# Patient Record
Sex: Female | Born: 1975 | Race: Black or African American | Hispanic: No | Marital: Married | Smoking: Never smoker
Health system: Southern US, Community
[De-identification: ages and names within clinical notes are randomized; demographics above are authoritative.]

## PROBLEM LIST (undated history)

## (undated) ENCOUNTER — Inpatient Hospital Stay (HOSPITAL_COMMUNITY): Payer: Self-pay

## (undated) DIAGNOSIS — O24419 Gestational diabetes mellitus in pregnancy, unspecified control: Secondary | ICD-10-CM

## (undated) DIAGNOSIS — F32A Depression, unspecified: Secondary | ICD-10-CM

## (undated) DIAGNOSIS — N979 Female infertility, unspecified: Secondary | ICD-10-CM

## (undated) DIAGNOSIS — E282 Polycystic ovarian syndrome: Secondary | ICD-10-CM

## (undated) DIAGNOSIS — E221 Hyperprolactinemia: Secondary | ICD-10-CM

## (undated) DIAGNOSIS — F419 Anxiety disorder, unspecified: Secondary | ICD-10-CM

## (undated) DIAGNOSIS — K219 Gastro-esophageal reflux disease without esophagitis: Secondary | ICD-10-CM

## (undated) DIAGNOSIS — T7840XA Allergy, unspecified, initial encounter: Secondary | ICD-10-CM

## (undated) DIAGNOSIS — Z789 Other specified health status: Secondary | ICD-10-CM

## (undated) DIAGNOSIS — Z8619 Personal history of other infectious and parasitic diseases: Secondary | ICD-10-CM

## (undated) DIAGNOSIS — F329 Major depressive disorder, single episode, unspecified: Secondary | ICD-10-CM

## (undated) HISTORY — DX: Gastro-esophageal reflux disease without esophagitis: K21.9

## (undated) HISTORY — DX: Allergy, unspecified, initial encounter: T78.40XA

## (undated) HISTORY — DX: Hyperprolactinemia: E22.1

## (undated) HISTORY — DX: Anxiety disorder, unspecified: F41.9

## (undated) HISTORY — DX: Personal history of other infectious and parasitic diseases: Z86.19

## (undated) HISTORY — DX: Depression, unspecified: F32.A

## (undated) HISTORY — PX: HERNIA REPAIR: SHX51

## (undated) HISTORY — DX: Female infertility, unspecified: N97.9

## (undated) HISTORY — DX: Gestational diabetes mellitus in pregnancy, unspecified control: O24.419

---
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
## 1999-05-16 ENCOUNTER — Emergency Department (HOSPITAL_COMMUNITY): Admission: EM | Admit: 1999-05-16 | Discharge: 1999-05-17 | Payer: Self-pay | Admitting: Emergency Medicine

## 1999-07-22 ENCOUNTER — Emergency Department (HOSPITAL_COMMUNITY): Admission: EM | Admit: 1999-07-22 | Discharge: 1999-07-22 | Payer: Self-pay

## 1999-10-19 ENCOUNTER — Other Ambulatory Visit: Admission: RE | Admit: 1999-10-19 | Discharge: 1999-10-19 | Payer: Self-pay | Admitting: *Deleted

## 2000-09-02 ENCOUNTER — Emergency Department (HOSPITAL_COMMUNITY): Admission: EM | Admit: 2000-09-02 | Discharge: 2000-09-02 | Payer: Self-pay | Admitting: Emergency Medicine

## 2000-12-21 ENCOUNTER — Emergency Department (HOSPITAL_COMMUNITY): Admission: EM | Admit: 2000-12-21 | Discharge: 2000-12-21 | Payer: Self-pay

## 2001-04-16 ENCOUNTER — Other Ambulatory Visit: Admission: RE | Admit: 2001-04-16 | Discharge: 2001-04-16 | Payer: Self-pay | Admitting: *Deleted

## 2003-05-03 ENCOUNTER — Other Ambulatory Visit: Admission: RE | Admit: 2003-05-03 | Discharge: 2003-05-03 | Payer: Self-pay | Admitting: Obstetrics and Gynecology

## 2003-10-03 ENCOUNTER — Emergency Department (HOSPITAL_COMMUNITY): Admission: EM | Admit: 2003-10-03 | Discharge: 2003-10-04 | Payer: Self-pay | Admitting: Emergency Medicine

## 2004-10-27 ENCOUNTER — Emergency Department (HOSPITAL_COMMUNITY): Admission: EM | Admit: 2004-10-27 | Discharge: 2004-10-27 | Payer: Self-pay | Admitting: Emergency Medicine

## 2011-10-16 ENCOUNTER — Other Ambulatory Visit: Payer: Self-pay | Admitting: Obstetrics and Gynecology

## 2011-10-23 ENCOUNTER — Ambulatory Visit
Admission: RE | Admit: 2011-10-23 | Discharge: 2011-10-23 | Disposition: A | Discharge status: Home / Self Care | Payer: 59 | Source: Ambulatory Visit | Attending: Obstetrics and Gynecology | Admitting: Obstetrics and Gynecology

## 2011-10-23 MED ORDER — GADOBENATE DIMEGLUMINE 529 MG/ML IV SOLN
15.0000 mL | Freq: Once | INTRAVENOUS | Status: AC | PRN
  Administered 2011-10-23: 15 mL via INTRAVENOUS

## 2012-06-22 DIAGNOSIS — I10 Essential (primary) hypertension: Secondary | ICD-10-CM | POA: Insufficient documentation

## 2012-06-22 DIAGNOSIS — N915 Oligomenorrhea, unspecified: Secondary | ICD-10-CM | POA: Insufficient documentation

## 2013-10-27 ENCOUNTER — Inpatient Hospital Stay (HOSPITAL_COMMUNITY)
Admission: AD | Admit: 2013-10-27 | Discharge: 2013-10-27 | Disposition: A | Discharge status: Home / Self Care | Payer: 59 | Source: Ambulatory Visit | Attending: Obstetrics and Gynecology | Admitting: Obstetrics and Gynecology

## 2013-10-27 ENCOUNTER — Encounter (HOSPITAL_COMMUNITY): Payer: Self-pay | Admitting: *Deleted

## 2013-10-27 ENCOUNTER — Other Ambulatory Visit: Payer: Self-pay | Admitting: Obstetrics and Gynecology

## 2013-10-27 DIAGNOSIS — O00109 Unspecified tubal pregnancy without intrauterine pregnancy: Secondary | ICD-10-CM | POA: Insufficient documentation

## 2013-10-27 HISTORY — DX: Other specified health status: Z78.9

## 2013-10-27 LAB — TYPE AND SCREEN
ABO/RH(D): A POS
Antibody Screen: NEGATIVE

## 2013-10-27 LAB — ABO/RH: ABO/RH(D): A POS

## 2013-10-27 LAB — CBC WITH DIFFERENTIAL/PLATELET
Basophils Absolute: 0 10*3/uL (ref 0.0–0.1)
Basophils Relative: 0 % (ref 0–1)
Eosinophils Absolute: 0.1 10*3/uL (ref 0.0–0.7)
Eosinophils Relative: 1 % (ref 0–5)
HCT: 37.2 % (ref 36.0–46.0)
Hemoglobin: 12.5 g/dL (ref 12.0–15.0)
Lymphocytes Relative: 47 % — ABNORMAL HIGH (ref 12–46)
Lymphs Abs: 4.6 10*3/uL — ABNORMAL HIGH (ref 0.7–4.0)
MCH: 27.5 pg (ref 26.0–34.0)
MCHC: 33.6 g/dL (ref 30.0–36.0)
MCV: 81.8 fL (ref 78.0–100.0)
Monocytes Absolute: 0.9 10*3/uL (ref 0.1–1.0)
Monocytes Relative: 9 % (ref 3–12)
Neutro Abs: 4.1 10*3/uL (ref 1.7–7.7)
Neutrophils Relative %: 42 % — ABNORMAL LOW (ref 43–77)
Platelets: 364 10*3/uL (ref 150–400)
RBC: 4.55 MIL/uL (ref 3.87–5.11)
RDW: 13.5 % (ref 11.5–15.5)
WBC: 9.7 10*3/uL (ref 4.0–10.5)

## 2013-10-27 LAB — CREATININE, SERUM
Creatinine, Ser: 1.1 mg/dL (ref 0.50–1.10)
GFR calc Af Amer: 74 mL/min — ABNORMAL LOW (ref >90)
GFR calc non Af Amer: 64 mL/min — ABNORMAL LOW (ref >90)

## 2013-10-27 LAB — HCG, QUANTITATIVE, PREGNANCY: hCG, Beta Chain, Quant, S: 343 m[IU]/mL — ABNORMAL HIGH (ref <5)

## 2013-10-27 LAB — AST: AST: 15 U/L (ref 0–37)

## 2013-10-27 LAB — BUN: BUN: 11 mg/dL (ref 6–23)

## 2013-10-27 MED ORDER — METHOTREXATE INJECTION FOR WOMEN'S HOSPITAL
50.0000 mg/m2 | Freq: Once | INTRAMUSCULAR | Status: AC
  Administered 2013-10-27: 115 mg via INTRAMUSCULAR
  Filled 2013-10-27: qty 2.3

## 2013-10-27 NOTE — MAU Note (Signed)
Pt in for Methotrexate inj for ectopic pregnancy.

## 2013-10-30 ENCOUNTER — Inpatient Hospital Stay (HOSPITAL_COMMUNITY)
Admission: AD | Admit: 2013-10-30 | Discharge: 2013-10-30 | Disposition: A | Discharge status: Home / Self Care | Payer: 59 | Source: Ambulatory Visit | Attending: Obstetrics and Gynecology | Admitting: Obstetrics and Gynecology

## 2013-10-30 ENCOUNTER — Inpatient Hospital Stay (HOSPITAL_COMMUNITY): Payer: 59

## 2013-10-30 DIAGNOSIS — O00109 Unspecified tubal pregnancy without intrauterine pregnancy: Secondary | ICD-10-CM | POA: Insufficient documentation

## 2013-10-30 LAB — HCG, QUANTITATIVE, PREGNANCY: hCG, Beta Chain, Quant, S: 542 m[IU]/mL — ABNORMAL HIGH (ref <5)

## 2013-10-30 NOTE — MAU Note (Signed)
Dr Juliene Pina here discussing test results and plan of care with pt

## 2013-10-30 NOTE — MAU Note (Signed)
Dr Juliene Pina notified of pt's u/s report. Will come see pt and discuss results and plan of care. Pt aware

## 2013-10-30 NOTE — MAU Note (Signed)
Pt d/c ambulatory with spouse. Will f/u in office Tues as scheduled. Verbal instructions given to pt by Dr Juliene Pina.

## 2013-10-30 NOTE — MAU Note (Signed)
Loanne Drilling CNM called back. Will put in order for pelvic u/s and then call Dr Juliene Pina with results. Pt aware

## 2013-10-30 NOTE — MAU Provider Note (Signed)
  History     CSN: [id number]  Arrival date and time: 10/30/13 1814   None    Chief Complaint  Patient presents with  . Repeat BHCG post MTX    HPI D4 s/p Methotrexate for low abn rising bHCG. Methotrexate on 10/29. Here for lab and denies any complaints. Feels slight cramping and some left pelvic pain not needing any pain meds.  Labs reviewed. RIse noted from D1 to D4.   Past Medical History  Diagnosis Date  . Medical history non-contributory     Past Surgical History  Procedure Laterality Date  . Hernia repair      Family History  Problem Relation Age of Onset  . Diabetes Mother   . Cancer Mother   . Heart disease Mother   . Diabetes Father     History  Substance Use Topics  . Smoking status: Never Smoker   . Smokeless tobacco: Not on file  . Alcohol Use: No    Allergies: No Known Allergies  Prescriptions prior to admission  Medication Sig Dispense Refill  . metFORMIN (GLUCOPHAGE) 500 MG tablet Take 500 mg by mouth 2 (two) times daily with a meal.      . Prenatal Vit-Fe Fumarate-FA (MULTIVITAMIN-PRENATAL) 27-0.8 MG TABS tablet Take 1 tablet by mouth daily at 12 noon.        ROS Physical Exam   Blood pressure 135/81, pulse 82, resp. rate 20.  Physical Exam A&O x 3, no acute distress. Pleasant HEENT neg CV RRR Abdo soft, non tender, non acute Extr no edema/ tenderness Pelvic deferred, tolerated pelvic sono without pain  MAU Course  Procedures Pelvic sono - No IUP or EUP noted, no pelvic masses, no free fluid noted. CLC noted.   Assessment and Plan  37 yo, infertility with Femara pregnancy, abnormal rise in HCG and being treated for suspected ectopic or possible failed IUP. No pain or bleeding but rise in HCG from day1 to day 4. Pelvic sono stable. Reviewed ectopic precautions and warning s/s. Understands needs D7 HCG to assess if successful treatment or repeat treatment vs surgery vs d&c needed. F/up office for labs on 11/4.    Jahlil Ziller  R 10/30/2013, 10:15 PM

## 2013-10-30 NOTE — MAU Note (Signed)
Blanchie Dessert CNM notified of pt's BHCG result. CNM will talk with Dr Juliene Pina and call back.

## 2014-04-07 ENCOUNTER — Other Ambulatory Visit (HOSPITAL_COMMUNITY): Payer: Self-pay | Admitting: Obstetrics and Gynecology

## 2014-04-07 DIAGNOSIS — N970 Female infertility associated with anovulation: Secondary | ICD-10-CM

## 2014-04-13 ENCOUNTER — Ambulatory Visit (HOSPITAL_COMMUNITY)
Admission: RE | Admit: 2014-04-13 | Discharge: 2014-04-13 | Disposition: A | Discharge status: Home / Self Care | Payer: 59 | Source: Ambulatory Visit | Attending: Obstetrics and Gynecology | Admitting: Obstetrics and Gynecology

## 2014-04-13 ENCOUNTER — Encounter (INDEPENDENT_AMBULATORY_CARE_PROVIDER_SITE_OTHER): Payer: Self-pay

## 2014-04-13 DIAGNOSIS — N970 Female infertility associated with anovulation: Secondary | ICD-10-CM

## 2014-04-13 DIAGNOSIS — N979 Female infertility, unspecified: Secondary | ICD-10-CM | POA: Insufficient documentation

## 2014-04-13 MED ORDER — IOHEXOL 300 MG/ML  SOLN
20.0000 mL | Freq: Once | INTRAMUSCULAR | Status: AC | PRN
  Administered 2014-04-13: 20 mL

## 2014-10-31 ENCOUNTER — Encounter (HOSPITAL_COMMUNITY): Payer: Self-pay | Admitting: *Deleted

## 2015-02-23 LAB — OB RESULTS CONSOLE ABO/RH: RH TYPE: POSITIVE

## 2015-02-23 LAB — OB RESULTS CONSOLE RPR: RPR: NONREACTIVE

## 2015-02-23 LAB — OB RESULTS CONSOLE HIV ANTIBODY (ROUTINE TESTING): HIV: NONREACTIVE

## 2015-02-23 LAB — OB RESULTS CONSOLE RUBELLA ANTIBODY, IGM: Rubella: IMMUNE

## 2015-02-23 LAB — OB RESULTS CONSOLE ANTIBODY SCREEN: Antibody Screen: NEGATIVE

## 2015-02-23 LAB — OB RESULTS CONSOLE HEPATITIS B SURFACE ANTIGEN: HEP B S AG: NEGATIVE

## 2015-03-07 LAB — OB RESULTS CONSOLE GC/CHLAMYDIA
CHLAMYDIA, DNA PROBE: NEGATIVE
Gonorrhea: NEGATIVE

## 2015-07-12 ENCOUNTER — Encounter: Payer: 59 | Attending: Obstetrics and Gynecology

## 2015-07-12 VITALS — Ht 67.5 in | Wt 259.3 lb

## 2015-07-12 DIAGNOSIS — Z6839 Body mass index (BMI) 39.0-39.9, adult: Secondary | ICD-10-CM | POA: Diagnosis not present

## 2015-07-12 DIAGNOSIS — Z3A00 Weeks of gestation of pregnancy not specified: Secondary | ICD-10-CM | POA: Insufficient documentation

## 2015-07-12 DIAGNOSIS — O24410 Gestational diabetes mellitus in pregnancy, diet controlled: Secondary | ICD-10-CM | POA: Diagnosis not present

## 2015-07-16 NOTE — Progress Notes (Signed)
  Patient was seen on 07/12/15 for Gestational Diabetes self-management . The following learning objectives were met by the patient :   States the definition of Gestational Diabetes  States why dietary management is important in controlling blood glucose  Describes the effects of carbohydrates on blood glucose levels  Demonstrates ability to create a balanced meal plan  Demonstrates carbohydrate counting   States when to check blood glucose levels  Demonstrates proper blood glucose monitoring techniques  States the effect of stress and exercise on blood glucose levels  States the importance of limiting caffeine and abstaining from alcohol and smoking  Plan:  Aim for 2 Carb Choices per meal (30 grams) +/- 1 either way for breakfast Aim for 3 Carb Choices per meal (45 grams) +/- 1 either way from lunch and dinner Aim for 1-2 Carbs per snack Begin reading food labels for Total Carbohydrate and sugar grams of foods Consider  increasing your activity level by walking daily as tolerated Begin checking BG before breakfast and 2 hours after first bit of breakfast, lunch and dinner after  as directed by MD  Take medication  as directed by MD  Blood glucose monitor given:Accu-chek [employer] Connect Lot # [id number] Exp: 04/28/16 Blood glucose reading: 04/28/16  Patient instructed to monitor glucose levels: FBS: 60 - <90 2 hour: <120  Patient received the following handouts:  Nutrition Diabetes and Pregnancy  Carbohydrate Counting List  Meal Planning worksheet  Patient will be seen for follow-up as needed.

## 2015-08-19 ENCOUNTER — Encounter (HOSPITAL_COMMUNITY): Payer: Self-pay

## 2015-08-19 ENCOUNTER — Inpatient Hospital Stay (HOSPITAL_COMMUNITY)
Admission: AD | Admit: 2015-08-19 | Discharge: 2015-08-19 | Disposition: A | Discharge status: Home / Self Care | Payer: 59 | Source: Ambulatory Visit | Attending: Obstetrics and Gynecology | Admitting: Obstetrics and Gynecology

## 2015-08-19 DIAGNOSIS — O24419 Gestational diabetes mellitus in pregnancy, unspecified control: Secondary | ICD-10-CM | POA: Diagnosis not present

## 2015-08-19 DIAGNOSIS — R110 Nausea: Secondary | ICD-10-CM | POA: Diagnosis not present

## 2015-08-19 DIAGNOSIS — R42 Dizziness and giddiness: Secondary | ICD-10-CM | POA: Insufficient documentation

## 2015-08-19 DIAGNOSIS — Z3A35 35 weeks gestation of pregnancy: Secondary | ICD-10-CM | POA: Insufficient documentation

## 2015-08-19 HISTORY — DX: Polycystic ovarian syndrome: E28.2

## 2015-08-19 HISTORY — DX: Gestational diabetes mellitus in pregnancy, unspecified control: O24.419

## 2015-08-19 LAB — URINE MICROSCOPIC-ADD ON

## 2015-08-19 LAB — URINALYSIS, ROUTINE W REFLEX MICROSCOPIC
Bilirubin Urine: NEGATIVE
Glucose, UA: NEGATIVE mg/dL
Ketones, ur: NEGATIVE mg/dL
Leukocytes, UA: NEGATIVE
Nitrite: NEGATIVE
Protein, ur: NEGATIVE mg/dL
Specific Gravity, Urine: 1.01 (ref 1.005–1.030)
Urobilinogen, UA: 0.2 mg/dL (ref 0.0–1.0)
pH: 7 (ref 5.0–8.0)

## 2015-08-19 LAB — GLUCOSE, CAPILLARY: Glucose-Capillary: 91 mg/dL (ref 65–99)

## 2015-08-19 NOTE — MAU Provider Note (Signed)
History     CSN: [id number]  Arrival date and time: 08/19/15 2104   Chief Complaint  Patient presents with  . Dizziness   HPI  Nausea and dizziness after meal at 1900  Checked CBG - 39  Called Dr Cherly Hensen to discuss - told ok but felt she needed to be seen in ED so just came in  No nausea on arrival No vomiting No abdominal pain or ctx / + FM / no bleeding or vaginal discharge Feels tired and weak - dizziness "a little better" now  Past Medical History  Diagnosis Date  . Medical history non-contributory   . Gestational diabetes mellitus, antepartum   . PCOS (polycystic ovarian syndrome)   . Gestational diabetes     Past Surgical History  Procedure Laterality Date  . Hernia repair      Family History  Problem Relation Age of Onset  . Diabetes Mother   . Cancer Mother   . Heart disease Mother   . Diabetes Father     Social History  Substance Use Topics  . Smoking status: Never Smoker   . Smokeless tobacco: None  . Alcohol Use: No    Allergies: No Known Allergies  Prescriptions prior to admission  Medication Sig Dispense Refill Last Dose  . acetaminophen (TYLENOL) 325 MG tablet Take 650 mg by mouth every 6 (six) hours as needed for mild pain.   Past Week at Unknown time  . diphenhydrAMINE (BENADRYL) 12.5 MG/5ML liquid Take 12.5 mg by mouth 4 (four) times daily as needed for allergies.   Past Week at Unknown time  . glyBURIDE (DIABETA) 2.5 MG tablet Take 2.5 mg by mouth daily with breakfast. One tab  in morning and 2 tablets at dinner.   08/19/2015 at 1630  . Prenatal Vit-Fe Fumarate-FA (MULTIVITAMIN-PRENATAL) 27-0.8 MG TABS tablet Take 1 tablet by mouth daily at 12 noon.   08/18/2015 at Unknown time  . metFORMIN (GLUCOPHAGE) 500 MG tablet Take 500 mg by mouth 2 (two) times daily with a meal.   Past Month at Unknown time    ROS   Physical Exam   Blood pressure 140/76, pulse 78, temperature 98.3 F (36.8 C), temperature source Oral, resp. rate 16, SpO2 99  %.  Physical Exam Alert and oriented - NAD or pain Abdomen soft and non-tender Uterus gravid and non-tender / + FM palpable NST - reactive / NO CTX Pelvic exam: deferred   Results for LUCI, BELLUCCI (MRN [id number]) as of 08/19/2015 22:59  Ref. Range 08/19/2015 21:10 08/19/2015 22:02  Glucose-Capillary Latest Ref Range: 65-99 mg/dL  91  Appearance Latest Ref Range: CLEAR  CLEAR   Bacteria, UA Latest Ref Range: RARE  RARE   Bilirubin Urine Latest Ref Range: NEGATIVE  NEGATIVE   Color, Urine Latest Ref Range: YELLOW  YELLOW   Glucose Latest Ref Range: NEGATIVE mg/dL NEGATIVE   Hgb urine dipstick Latest Ref Range: NEGATIVE  SMALL (A)   Ketones, ur Latest Ref Range: NEGATIVE mg/dL NEGATIVE   Leukocytes, UA Latest Ref Range: NEGATIVE  NEGATIVE   Nitrite Latest Ref Range: NEGATIVE  NEGATIVE   pH Latest Ref Range: 5.0-8.0  7.0   Protein Latest Ref Range: NEGATIVE mg/dL NEGATIVE   RBC / HPF Latest Ref Range: <3 RBC/hpf 0-2   Specific Gravity, Urine Latest Ref Range: 1.005-1.030  1.010   Squamous Epithelial / LPF Latest Ref Range: RARE  RARE   Urobilinogen, UA Latest Ref Range: 0.0-1.0 mg/dL 0.2   WBC, UA Latest  Ref Range: <3 WBC/hpf 0-2     MAU Course  Procedures   Assessment and Plan  35.1 weeks GDMa2 Nausea and dizziness  1) normal assessment & symptoms resolved spontaneously 2) instructed to call prior to MAU visit - follow provider instructions after talking to MD 3) Diet recall - high carb intake today and low water intake                         Need more protein / more frequent intake with 3 meals and 3 protein base snacks/day                         Increase water intake 4) DC home  Marlinda Mike 08/19/2015, 10:56 PM

## 2015-08-19 NOTE — MAU Note (Addendum)
Returned from 2 hour trip and came inside and started feeling dizzy about 30 min later,  Then nauseated.  Started at 7:30 pm and blood sugar was 88. Gest DM.  No vag bleeding.  No leaking.  Baby moving well.

## 2015-08-23 ENCOUNTER — Inpatient Hospital Stay (HOSPITAL_COMMUNITY)
Admission: AD | Admit: 2015-08-23 | Discharge: 2015-08-23 | Disposition: A | Discharge status: Home / Self Care | Payer: 59 | Source: Ambulatory Visit | Attending: Obstetrics & Gynecology | Admitting: Obstetrics & Gynecology

## 2015-08-23 ENCOUNTER — Inpatient Hospital Stay (HOSPITAL_COMMUNITY): Payer: 59

## 2015-08-23 ENCOUNTER — Encounter (HOSPITAL_COMMUNITY): Payer: Self-pay | Admitting: *Deleted

## 2015-08-23 DIAGNOSIS — E282 Polycystic ovarian syndrome: Secondary | ICD-10-CM | POA: Insufficient documentation

## 2015-08-23 DIAGNOSIS — O09523 Supervision of elderly multigravida, third trimester: Secondary | ICD-10-CM

## 2015-08-23 DIAGNOSIS — Z36 Encounter for antenatal screening of mother: Secondary | ICD-10-CM | POA: Insufficient documentation

## 2015-08-23 DIAGNOSIS — O24419 Gestational diabetes mellitus in pregnancy, unspecified control: Secondary | ICD-10-CM | POA: Diagnosis not present

## 2015-08-23 DIAGNOSIS — Z3A35 35 weeks gestation of pregnancy: Secondary | ICD-10-CM | POA: Insufficient documentation

## 2015-08-23 DIAGNOSIS — O288 Other abnormal findings on antenatal screening of mother: Secondary | ICD-10-CM

## 2015-08-23 DIAGNOSIS — Z3689 Encounter for other specified antenatal screening: Secondary | ICD-10-CM

## 2015-08-23 HISTORY — DX: Gestational diabetes mellitus in pregnancy, unspecified control: O24.419

## 2015-08-23 NOTE — MAU Provider Note (Signed)
History     CSN: [id number]  Arrival date and time: 08/23/15 1853  Provider notified: 1740 Provider on unit: 1800 Provider at bedside: 1900   HPI  MS. Katherine Hood is a G2P0010 female at 35.[redacted] wks gestation sent over from [facility] office for CEFM d/t NR NST in the office.  She is a Class A2GDM.  She has no other complaints.  Her primary OB provider at [facility] is Dr. Cherly Hensen.  Past Medical History  Diagnosis Date  . Medical history non-contributory   . Gestational diabetes mellitus, antepartum   . PCOS (polycystic ovarian syndrome)   . Gestational diabetes   . Gestational diabetes mellitus 08/23/2015    Past Surgical History  Procedure Laterality Date  . Hernia repair      Family History  Problem Relation Age of Onset  . Diabetes Mother   . Cancer Mother   . Heart disease Mother   . Diabetes Father     Social History  Substance Use Topics  . Smoking status: Never Smoker   . Smokeless tobacco: None  . Alcohol Use: No    Allergies: No Known Allergies  Prescriptions prior to admission  Medication Sig Dispense Refill Last Dose  . acetaminophen (TYLENOL) 325 MG tablet Take 650 mg by mouth every 6 (six) hours as needed for mild pain.   Past Week at Unknown time  . diphenhydrAMINE (BENADRYL) 12.5 MG/5ML liquid Take 12.5 mg by mouth 4 (four) times daily as needed for allergies.   Past Week at Unknown time  . glyBURIDE (DIABETA) 2.5 MG tablet Take 2.5 mg by mouth daily with breakfast. One tab  in morning and 2 tablets at dinner.   08/19/2015 at 1630  . Prenatal Vit-Fe Fumarate-FA (MULTIVITAMIN-PRENATAL) 27-0.8 MG TABS tablet Take 1 tablet by mouth daily at 12 noon.   08/18/2015 at Unknown time    Review of Systems  Constitutional:       D/T blood sugar of 68 mg/dL  HENT: Negative.   Eyes: Negative.   Respiratory: Negative.   Cardiovascular: Negative.   Gastrointestinal: Negative.   Genitourinary: Negative.   Musculoskeletal: Negative.   Skin: Negative.   Neurological:  Positive for weakness.  Endo/Heme/Allergies: Negative.   Psychiatric/Behavioral: Negative.    CEFM FHR: 130 bpm / moderate variability / accels present / no decels TOCO: Occ. UC's with UI  BPP: 8/8 AFI: WNL  Physical Exam   Blood pressure 147/76, pulse 86, temperature 98.2 F (36.8 C), temperature source Oral, resp. rate 18, height 5' 7.5" (1.715 m), weight 119.296 kg (263 lb), SpO2 98 %.  Physical Exam  Constitutional: She is oriented to person, place, and time. She appears well-developed and well-nourished.  HENT:  Head: Normocephalic.  Eyes: Pupils are equal, round, and reactive to light.  Neck: Normal range of motion.  Cardiovascular: Normal rate, regular rhythm and normal heart sounds.   Respiratory: Effort normal and breath sounds normal.  GI: Soft. Bowel sounds are normal.  Genitourinary:  Gravid; soft; non tender; pelvic deferred  Musculoskeletal: Normal range of motion.  Neurological: She is alert and oriented to person, place, and time.  Skin: Skin is warm and dry.  Psychiatric: She has a normal mood and affect. Her behavior is normal. Judgment and thought content normal.    MAU Course  Procedures CEFM BPP AFI Assessment and Plan  G2P0010 at 36.[redacted] wks gestation Non-Reassuring FHR tracing (in office) Category 1 FHR tracing  Discharge home Keep scheduled appts with Dr. Berdine Dance, [place], [place]  MSN, CNM 08/23/2015, 7:17 PM

## 2015-08-23 NOTE — MAU Note (Signed)
Pt was sent from office for nonreactive NST for further monitoring and U/S with BPP.  Pt reports good fetal movement.  Denies ROM, vaginal bleeding or abnormal discharge.

## 2015-08-23 NOTE — Discharge Instructions (Signed)

## 2015-08-24 ENCOUNTER — Encounter (HOSPITAL_COMMUNITY): Payer: Self-pay

## 2015-08-31 LAB — OB RESULTS CONSOLE GBS: GBS: NEGATIVE

## 2015-09-19 ENCOUNTER — Encounter (HOSPITAL_COMMUNITY): Payer: Self-pay | Admitting: *Deleted

## 2015-09-19 ENCOUNTER — Telehealth (HOSPITAL_COMMUNITY): Payer: Self-pay | Admitting: *Deleted

## 2015-09-19 NOTE — Telephone Encounter (Signed)
Preadmission screen  

## 2015-09-20 ENCOUNTER — Other Ambulatory Visit: Payer: Self-pay | Admitting: Obstetrics and Gynecology

## 2015-09-22 ENCOUNTER — Inpatient Hospital Stay (HOSPITAL_COMMUNITY)
Admission: RE | Admit: 2015-09-22 | Discharge: 2015-09-26 | DRG: 765 | Disposition: A | Discharge status: Home / Self Care | Payer: 59 | Source: Ambulatory Visit | Attending: Obstetrics and Gynecology | Admitting: Obstetrics and Gynecology

## 2015-09-22 ENCOUNTER — Encounter (HOSPITAL_COMMUNITY): Payer: Self-pay

## 2015-09-22 ENCOUNTER — Inpatient Hospital Stay (HOSPITAL_COMMUNITY): Payer: 59 | Admitting: Anesthesiology

## 2015-09-22 VITALS — BP 145/78 | HR 80 | Temp 97.3°F | Resp 18 | Ht 67.5 in | Wt 270.0 lb

## 2015-09-22 DIAGNOSIS — O09529 Supervision of elderly multigravida, unspecified trimester: Secondary | ICD-10-CM

## 2015-09-22 DIAGNOSIS — O99214 Obesity complicating childbirth: Secondary | ICD-10-CM | POA: Diagnosis present

## 2015-09-22 DIAGNOSIS — O139 Gestational [pregnancy-induced] hypertension without significant proteinuria, unspecified trimester: Secondary | ICD-10-CM | POA: Diagnosis present

## 2015-09-22 DIAGNOSIS — O9852 Other viral diseases complicating childbirth: Secondary | ICD-10-CM | POA: Diagnosis present

## 2015-09-22 DIAGNOSIS — Z3A40 40 weeks gestation of pregnancy: Secondary | ICD-10-CM | POA: Diagnosis present

## 2015-09-22 DIAGNOSIS — O133 Gestational [pregnancy-induced] hypertension without significant proteinuria, third trimester: Secondary | ICD-10-CM | POA: Diagnosis not present

## 2015-09-22 DIAGNOSIS — O24429 Gestational diabetes mellitus in childbirth, unspecified control: Secondary | ICD-10-CM | POA: Diagnosis present

## 2015-09-22 DIAGNOSIS — O24419 Gestational diabetes mellitus in pregnancy, unspecified control: Secondary | ICD-10-CM

## 2015-09-22 DIAGNOSIS — O480 Post-term pregnancy: Secondary | ICD-10-CM | POA: Diagnosis present

## 2015-09-22 DIAGNOSIS — O9902 Anemia complicating childbirth: Secondary | ICD-10-CM | POA: Diagnosis present

## 2015-09-22 DIAGNOSIS — D62 Acute posthemorrhagic anemia: Secondary | ICD-10-CM | POA: Diagnosis present

## 2015-09-22 DIAGNOSIS — Z6841 Body Mass Index (BMI) 40.0 and over, adult: Secondary | ICD-10-CM | POA: Diagnosis not present

## 2015-09-22 DIAGNOSIS — Z79899 Other long term (current) drug therapy: Secondary | ICD-10-CM

## 2015-09-22 DIAGNOSIS — E221 Hyperprolactinemia: Secondary | ICD-10-CM | POA: Diagnosis present

## 2015-09-22 DIAGNOSIS — O24410 Gestational diabetes mellitus in pregnancy, diet controlled: Secondary | ICD-10-CM

## 2015-09-22 DIAGNOSIS — E282 Polycystic ovarian syndrome: Secondary | ICD-10-CM | POA: Diagnosis present

## 2015-09-22 HISTORY — DX: Gestational diabetes mellitus in pregnancy, unspecified control: O24.419

## 2015-09-22 LAB — GLUCOSE, CAPILLARY
GLUCOSE-CAPILLARY: 114 mg/dL — AB (ref 65–99)
GLUCOSE-CAPILLARY: 116 mg/dL — AB (ref 65–99)
GLUCOSE-CAPILLARY: 87 mg/dL (ref 65–99)
Glucose-Capillary: 105 mg/dL — ABNORMAL HIGH (ref 65–99)
Glucose-Capillary: 82 mg/dL (ref 65–99)

## 2015-09-22 LAB — TYPE AND SCREEN
ABO/RH(D): A POS
Antibody Screen: NEGATIVE

## 2015-09-22 LAB — CBC
HEMATOCRIT: 35.3 % — AB (ref 36.0–46.0)
Hemoglobin: 11.9 g/dL — ABNORMAL LOW (ref 12.0–15.0)
MCH: 28.5 pg (ref 26.0–34.0)
MCHC: 33.7 g/dL (ref 30.0–36.0)
MCV: 84.7 fL (ref 78.0–100.0)
Platelets: 246 10*3/uL (ref 150–400)
RBC: 4.17 MIL/uL (ref 3.87–5.11)
RDW: 14.6 % (ref 11.5–15.5)
WBC: 9.3 10*3/uL (ref 4.0–10.5)

## 2015-09-22 MED ORDER — LIDOCAINE HCL (PF) 1 % IJ SOLN
INTRAMUSCULAR | Status: DC | PRN
  Administered 2015-09-22: 3 mL via EPIDURAL
  Administered 2015-09-22: 5 mL via EPIDURAL
  Administered 2015-09-22: 2 mL via EPIDURAL

## 2015-09-22 MED ORDER — PHENYLEPHRINE 40 MCG/ML (10ML) SYRINGE FOR IV PUSH (FOR BLOOD PRESSURE SUPPORT): 80.0000 ug | PREFILLED_SYRINGE | INTRAVENOUS | Status: DC | PRN

## 2015-09-22 MED ORDER — TERBUTALINE SULFATE 1 MG/ML IJ SOLN: 0.2500 mg | Freq: Once | INTRAMUSCULAR | Status: DC | PRN

## 2015-09-22 MED ORDER — FENTANYL 2.5 MCG/ML BUPIVACAINE 1/10 % EPIDURAL INFUSION (WH - ANES)
INTRAMUSCULAR | Status: AC
  Administered 2015-09-22: 14 mL/h via EPIDURAL
  Filled 2015-09-22: qty 125

## 2015-09-22 MED ORDER — LACTATED RINGERS IV SOLN
500.0000 mL | INTRAVENOUS | Status: DC | PRN
  Administered 2015-09-22: 1000 mL via INTRAVENOUS

## 2015-09-22 MED ORDER — ONDANSETRON HCL 4 MG/2ML IJ SOLN
4.0000 mg | Freq: Four times a day (QID) | INTRAMUSCULAR | Status: DC | PRN
  Administered 2015-09-22 (×2): 4 mg via INTRAVENOUS
  Filled 2015-09-22 (×2): qty 2

## 2015-09-22 MED ORDER — CITRIC ACID-SODIUM CITRATE 334-500 MG/5ML PO SOLN
30.0000 mL | ORAL | Status: DC | PRN
  Administered 2015-09-23: 30 mL via ORAL
  Filled 2015-09-22: qty 15

## 2015-09-22 MED ORDER — FENTANYL 2.5 MCG/ML BUPIVACAINE 1/10 % EPIDURAL INFUSION (WH - ANES)
14.0000 mL/h | INTRAMUSCULAR | Status: DC | PRN
Start: 2015-09-22 — End: 2015-09-23
  Administered 2015-09-22 – 2015-09-23 (×3): 14 mL/h via EPIDURAL
  Filled 2015-09-22 (×2): qty 125

## 2015-09-22 MED ORDER — LACTATED RINGERS IV SOLN
INTRAVENOUS | Status: DC
  Administered 2015-09-22: 14:00:00 via INTRAUTERINE

## 2015-09-22 MED ORDER — ACETAMINOPHEN 325 MG PO TABS: 650.0000 mg | ORAL_TABLET | ORAL | Status: DC | PRN

## 2015-09-22 MED ORDER — BUTORPHANOL TARTRATE 2 MG/ML IJ SOLN: 2.0000 mg | INTRAMUSCULAR | Status: DC | PRN

## 2015-09-22 MED ORDER — OXYTOCIN 10 UNIT/ML IJ SOLN: 10.0000 [IU] | Freq: Once | INTRAMUSCULAR | Status: DC

## 2015-09-22 MED ORDER — OXYCODONE-ACETAMINOPHEN 5-325 MG PO TABS: 1.0000 | ORAL_TABLET | ORAL | Status: DC | PRN

## 2015-09-22 MED ORDER — OXYTOCIN BOLUS FROM INFUSION: 500.0000 mL | INTRAVENOUS | Status: DC

## 2015-09-22 MED ORDER — OXYCODONE-ACETAMINOPHEN 5-325 MG PO TABS: 2.0000 | ORAL_TABLET | ORAL | Status: DC | PRN

## 2015-09-22 MED ORDER — LACTATED RINGERS IV SOLN
INTRAVENOUS | Status: DC
  Administered 2015-09-22: 11:00:00 via INTRAVENOUS

## 2015-09-22 MED ORDER — OXYTOCIN 40 UNITS IN LACTATED RINGERS INFUSION - SIMPLE MED: 62.5000 mL/h | INTRAVENOUS | Status: DC

## 2015-09-22 MED ORDER — OXYTOCIN 40 UNITS IN LACTATED RINGERS INFUSION - SIMPLE MED
1.0000 m[IU]/min | INTRAVENOUS | Status: DC
  Administered 2015-09-22: 2 m[IU]/min via INTRAVENOUS
  Filled 2015-09-22: qty 1000

## 2015-09-22 MED ORDER — DIPHENHYDRAMINE HCL 50 MG/ML IJ SOLN: 12.5000 mg | INTRAMUSCULAR | Status: DC | PRN

## 2015-09-22 MED ORDER — EPHEDRINE 5 MG/ML INJ: 10.0000 mg | INTRAVENOUS | Status: DC | PRN

## 2015-09-22 MED ORDER — LIDOCAINE HCL (PF) 1 % IJ SOLN
30.0000 mL | INTRAMUSCULAR | Status: DC | PRN
  Filled 2015-09-22: qty 30

## 2015-09-22 MED ORDER — PHENYLEPHRINE 40 MCG/ML (10ML) SYRINGE FOR IV PUSH (FOR BLOOD PRESSURE SUPPORT)
PREFILLED_SYRINGE | INTRAVENOUS | Status: AC
  Filled 2015-09-22: qty 20

## 2015-09-22 NOTE — H&P (Signed)
Katherine Hood is Hood 39 y.o. female presenting @ term gestation for IOL due to Class A2 GDM History OB History    Gravida Para Term Preterm AB TAB SAB Ectopic Multiple Living   Past Medical History  Diagnosis Date  . Medical history non-contributory   . Gestational diabetes mellitus, antepartum   . PCOS (polycystic ovarian syndrome)   . Gestational diabetes   . Gestational diabetes mellitus 08/23/2015  . Hx of varicella   . Infertility, female   . Hyperprolactinemia    Past Surgical History  Procedure Laterality Date  . Hernia repair     Family History: family history includes Cancer in her mother; Diabetes in her father and mother; Endometriosis in her sister; Heart disease in her mother; Hypertension in her maternal grandmother. Social History:  reports that she has never smoked. She does not have any smokeless tobacco history on file. She reports that she does not drink alcohol or use illicit drugs.   Prenatal Transfer Tool  Maternal Diabetes: Yes:  Diabetes Type:  Insulin/Medication controlled Genetic Screening: Normal Maternal Ultrasounds/Referrals: Normal Fetal Ultrasounds or other Referrals:  None Maternal Substance Abuse:  No Significant Maternal Medications:  Meds include: Other: glyburide Significant Maternal Lab Results:  Lab values include: Group B Strep negative Other Comments:  None  Review of Systems  All other systems reviewed and are negative.     Last menstrual period 12/16/2014. Exam Physical Exam  Constitutional: She is oriented to person, place, and time. She appears well-developed and well-nourished.  HENT:  Head: Atraumatic.  Eyes: EOM are normal.  Neck: Neck supple.  Cardiovascular: Regular rhythm.   GI: Soft.  Musculoskeletal: She exhibits edema.  Neurological: She is alert and oriented to person, place, and time.  Skin: Skin is warm and dry.  Psychiatric: She has Hood normal mood and affect.   VE 2/90/-1  Prenatal  labs: ABO, Rh: Hood/Positive/-- (02/25 0000) Antibody: Negative (02/25 0000) Rubella: Immune (02/25 0000) RPR: Nonreactive (02/25 0000)  HBsAg: Negative (02/25 0000)  HIV: Non-reactive (02/25 0000)  GBS: Negative (09/01 0000)   Assessment/Plan: Class A2 GDM Term gestation P) admit BS qJENIYA FLANNIGANe labs. IV pitocin. Analgesic prn   Katherine Hood 09/22/2015, 7:54 AM

## 2015-09-22 NOTE — Progress Notes (Signed)
S: comfortable  O: epidural Pitocin VE; 8/100/-1 Tracing: baseline  155 mod variability. occ variable Ctx q 2-3 mins amnioinfusion  IMP: Class A2 GDM Protracted active phase Term gestation P) exaggerated sims position. Cont pitocin. Recheck 2 hours. Advised if no change need c/s

## 2015-09-22 NOTE — Progress Notes (Signed)
Katherine Hood is a 39 y.o. G2P0010 at [redacted]w[redacted]d by ultrasound admitted for induction of labor due to Gestational diabetes.  Subjective: c/o rectal pressure  Objective: BP 129/99 mmHg  Pulse 117  Temp(Src) 98.8 F (37.1 C) (Oral)  Resp 16  Ht 5' 7.5" (1.715 m)  Wt 122.471 kg (270 lb)  BMI 41.64 kg/m2  LMP 12/16/2014 I/O last 3 completed shifts: In: -  Out: 1100 [Urine:1100] Total I/O In: -  Out: 300 [Emesis/NG output:300]  FHT:  FHR: 165 bpm, variability: moderate,  accelerations:  Present,  decelerations:  Present occ variable UC:   regular, every 2 minutes SVE:   10 cm dilated, 100% effaced, +1 station. Small caput Tracing:  reassuring  Labs: Lab Results  Component Value Date   WBC 9.3 09/22/2015   HGB 11.9* 09/22/2015   HCT 35.3* 09/22/2015   MCV 84.7 09/22/2015   PLT 246 09/22/2015    Assessment / Plan: Induction of labor due to gestational diabetes,  progressing well on pitocin P) labor vtx down. Recheck 1 hour  Anticipated MOD:  NSVD  COUSINS,SHERONETTE A 09/22/2015, 10:10 PM

## 2015-09-22 NOTE — Anesthesia Preprocedure Evaluation (Addendum)
Anesthesia Evaluation  Patient identified by MRN, date of birth, ID band Patient awake    Reviewed: Allergy & Precautions, NPO status , Patient's Chart, lab work & pertinent test results  History of Anesthesia Complications Negative for: history of anesthetic complications  Airway Mallampati: III  TM Distance: >3 FB Neck ROM: Full    Dental  (+) Teeth Intact, Dental Advisory Given   Pulmonary neg pulmonary ROS,    Pulmonary exam normal breath sounds clear to auscultation       Cardiovascular Exercise Tolerance: Good (-) hypertensionNormal cardiovascular exam Rhythm:Regular Rate:Normal     Neuro/Psych negative neurological ROS     GI/Hepatic negative GI ROS, Neg liver ROS,   Endo/Other  diabetes, Gestational, Oral Hypoglycemic AgentsMorbid obesity  Renal/GU negative Renal ROS     Musculoskeletal negative musculoskeletal ROS (+)   Abdominal (+) + obese,   Peds  Hematology  (+) Blood dyscrasia, anemia ,   Anesthesia Other Findings Day of surgery medications reviewed with the patient.  Reproductive/Obstetrics (+) Pregnancy                           Anesthesia Physical Anesthesia Plan  ASA: III  Anesthesia Plan: Epidural   Post-op Pain Management:    Induction:   Airway Management Planned:   Additional Equipment: None  Intra-op Plan:   Post-operative Plan:   Informed Consent: I have reviewed the patients History and Physical, chart, labs and discussed the procedure including the risks, benefits and alternatives for the proposed anesthesia with the patient or authorized representative who has indicated his/her understanding and acceptance.   Dental advisory given  Plan Discussed with: Surgeon  Anesthesia Plan Comments: (Patient identified. Risks/Benefits/Options discussed with patient including but not limited to bleeding, infection, nerve damage, paralysis, failed block,  incomplete pain control, headache, blood pressure changes, nausea, vomiting, reactions to medication both or allergic, itching and postpartum back pain. Confirmed with bedside nurse the patient's most recent platelet count. Confirmed with patient that they are not currently taking any anticoagulation, have any bleeding history or any family history of bleeding disorders. Patient expressed understanding and wished to proceed. All questions were answered. )       Anesthesia Quick Evaluation

## 2015-09-22 NOTE — Anesthesia Procedure Notes (Signed)
Epidural Patient location during procedure: OB  Staffing Anesthesiologist: TURK, STEPHEN EDWARD Performed by: anesthesiologist   Preanesthetic Checklist Completed: patient identified, pre-op evaluation, timeout performed, IV checked, risks and benefits discussed and monitors and equipment checked  Epidural Patient position: sitting Prep: DuraPrep Patient monitoring: blood pressure and continuous pulse ox Approach: midline Location: L3-L4 Injection technique: LOR air  Needle:  Needle type: Tuohy  Needle gauge: 17 G Needle length: 9 cm Needle insertion depth: 6 cm Catheter size: 19 Gauge Catheter at skin depth: 11 cm Test dose: negative and Other (1% Lidocaine)  Additional Notes Patient identified.  Risk benefits discussed including failed block, incomplete pain control, headache, nerve damage, paralysis, blood pressure changes, nausea, vomiting, reactions to medication both toxic or allergic, and postpartum back pain.  Patient expressed understanding and wished to proceed.  All questions were answered.  Sterile technique used throughout procedure and epidural site dressed with sterile barrier dressing. No paresthesia or other complications noted. The patient did not experience any signs of intravascular injection such as tinnitus or metallic taste in mouth nor signs of intrathecal spread such as rapid motor block. Please see nursing notes for vital signs. Reason for block:procedure for pain   

## 2015-09-22 NOTE — Progress Notes (Signed)
S: sleeping  O: Epidural Pitocin Filed Vitals:   09/22/15 1131 09/22/15 1201 09/22/15 1220 09/22/15 1231  BP: 136/77 123/91  123/73  Pulse: 84 94  86  Temp:   98.4 F (36.9 C)   TempSrc:      Resp:  20    Height:      Weight:       VE 4/90/-1 LOT AROM light mec IUPC, ISE placed   Tracing: baseline 140 (+) small variables Ctx q 2- 2 1/2 mins  IMP: Class A2 GDM Term gestation P) left exaggerated sims. Amnioinfusion. Watch fetal monitor closely

## 2015-09-23 ENCOUNTER — Encounter (HOSPITAL_COMMUNITY): Payer: Self-pay

## 2015-09-23 ENCOUNTER — Encounter (HOSPITAL_COMMUNITY)
Admission: RE | Disposition: A | Discharge status: Home / Self Care | Payer: Self-pay | Source: Ambulatory Visit | Attending: Obstetrics and Gynecology

## 2015-09-23 LAB — COMPREHENSIVE METABOLIC PANEL
ALBUMIN: 2.8 g/dL — AB (ref 3.5–5.0)
ALK PHOS: 115 U/L (ref 38–126)
ALT: 14 U/L (ref 14–54)
ANION GAP: 9 (ref 5–15)
AST: 26 U/L (ref 15–41)
BUN: 9 mg/dL (ref 6–20)
CO2: 21 mmol/L — AB (ref 22–32)
Calcium: 9.2 mg/dL (ref 8.9–10.3)
Chloride: 106 mmol/L (ref 101–111)
Creatinine, Ser: 0.93 mg/dL (ref 0.44–1.00)
GFR calc Af Amer: >60 mL/min (ref >60)
GFR calc non Af Amer: >60 mL/min (ref >60)
GLUCOSE: 144 mg/dL — AB (ref 65–99)
POTASSIUM: 3.9 mmol/L (ref 3.5–5.1)
SODIUM: 136 mmol/L (ref 135–145)
Total Bilirubin: 0.5 mg/dL (ref 0.3–1.2)
Total Protein: 6.4 g/dL — ABNORMAL LOW (ref 6.5–8.1)

## 2015-09-23 LAB — GLUCOSE, CAPILLARY
GLUCOSE-CAPILLARY: 140 mg/dL — AB (ref 65–99)
Glucose-Capillary: 128 mg/dL — ABNORMAL HIGH (ref 65–99)
Glucose-Capillary: 95 mg/dL (ref 65–99)
Glucose-Capillary: 141 mg/dL — ABNORMAL HIGH (ref 65–99)

## 2015-09-23 LAB — CBC
HEMATOCRIT: 33.7 % — AB (ref 36.0–46.0)
HEMOGLOBIN: 11.4 g/dL — AB (ref 12.0–15.0)
MCH: 28.6 pg (ref 26.0–34.0)
MCHC: 33.8 g/dL (ref 30.0–36.0)
MCV: 84.7 fL (ref 78.0–100.0)
Platelets: 224 10*3/uL (ref 150–400)
RBC: 3.98 MIL/uL (ref 3.87–5.11)
RDW: 14.4 % (ref 11.5–15.5)
WBC: 27.5 10*3/uL — AB (ref 4.0–10.5)

## 2015-09-23 LAB — URIC ACID: Uric Acid, Serum: 6.2 mg/dL (ref 2.3–6.6)

## 2015-09-23 LAB — RPR: RPR: NONREACTIVE

## 2015-09-23 SURGERY — Surgical Case
Anesthesia: Epidural

## 2015-09-23 MED ORDER — NALBUPHINE HCL 10 MG/ML IJ SOLN
5.0000 mg | INTRAMUSCULAR | Status: DC | PRN
  Filled 2015-09-23: qty 0.5

## 2015-09-23 MED ORDER — FENTANYL CITRATE (PF) 100 MCG/2ML IJ SOLN
INTRAMUSCULAR | Status: AC
  Filled 2015-09-23: qty 2

## 2015-09-23 MED ORDER — OXYTOCIN 10 UNIT/ML IJ SOLN
40.0000 [IU] | INTRAMUSCULAR | Status: DC | PRN
  Administered 2015-09-23: 40 [IU] via INTRAVENOUS

## 2015-09-23 MED ORDER — BUPIVACAINE HCL (PF) 0.25 % IJ SOLN
INTRAMUSCULAR | Status: DC | PRN
  Administered 2015-09-23: 30 mL

## 2015-09-23 MED ORDER — KETOROLAC TROMETHAMINE 30 MG/ML IJ SOLN
INTRAMUSCULAR | Status: AC
  Filled 2015-09-23: qty 1

## 2015-09-23 MED ORDER — ACETAMINOPHEN 325 MG PO TABS: 650.0000 mg | ORAL_TABLET | ORAL | Status: DC | PRN

## 2015-09-23 MED ORDER — DIPHENHYDRAMINE HCL 25 MG PO CAPS
25.0000 mg | ORAL_CAPSULE | ORAL | Status: DC | PRN
  Administered 2015-09-23 (×2): 25 mg via ORAL
  Filled 2015-09-23 (×2): qty 1

## 2015-09-23 MED ORDER — WITCH HAZEL-GLYCERIN EX PADS: 1.0000 "application " | MEDICATED_PAD | CUTANEOUS | Status: DC | PRN

## 2015-09-23 MED ORDER — NALBUPHINE HCL 10 MG/ML IJ SOLN
5.0000 mg | Freq: Once | INTRAMUSCULAR | Status: DC | PRN
  Filled 2015-09-23: qty 0.5

## 2015-09-23 MED ORDER — DIPHENHYDRAMINE HCL 50 MG/ML IJ SOLN: 12.5000 mg | INTRAMUSCULAR | Status: DC | PRN

## 2015-09-23 MED ORDER — SIMETHICONE 80 MG PO CHEW
80.0000 mg | CHEWABLE_TABLET | ORAL | Status: DC
  Administered 2015-09-23 – 2015-09-26 (×4): 80 mg via ORAL
  Filled 2015-09-23 (×3): qty 1

## 2015-09-23 MED ORDER — SCOPOLAMINE 1 MG/3DAYS TD PT72
1.0000 | MEDICATED_PATCH | Freq: Once | TRANSDERMAL | Status: DC
  Filled 2015-09-23: qty 1

## 2015-09-23 MED ORDER — HYDROCHLOROTHIAZIDE 25 MG PO TABS
25.0000 mg | ORAL_TABLET | Freq: Every day | ORAL | Status: DC
  Administered 2015-09-23 – 2015-09-26 (×4): 25 mg via ORAL
  Filled 2015-09-23 (×5): qty 1

## 2015-09-23 MED ORDER — LABETALOL HCL 5 MG/ML IV SOLN
INTRAVENOUS | Status: AC
  Filled 2015-09-23: qty 4

## 2015-09-23 MED ORDER — DEXTROSE 5 % IV SOLN
1.0000 ug/kg/h | INTRAVENOUS | Status: DC | PRN
  Filled 2015-09-23: qty 2

## 2015-09-23 MED ORDER — FERROUS SULFATE 325 (65 FE) MG PO TABS
325.0000 mg | ORAL_TABLET | Freq: Two times a day (BID) | ORAL | Status: DC
  Administered 2015-09-23 – 2015-09-24 (×3): 325 mg via ORAL
  Filled 2015-09-23 (×3): qty 1

## 2015-09-23 MED ORDER — MORPHINE SULFATE (PF) 0.5 MG/ML IJ SOLN
INTRAMUSCULAR | Status: AC
  Filled 2015-09-23: qty 100

## 2015-09-23 MED ORDER — OXYCODONE-ACETAMINOPHEN 5-325 MG PO TABS
1.0000 | ORAL_TABLET | ORAL | Status: DC | PRN
  Administered 2015-09-23 – 2015-09-26 (×7): 1 via ORAL
  Filled 2015-09-23 (×8): qty 1

## 2015-09-23 MED ORDER — SIMETHICONE 80 MG PO CHEW
80.0000 mg | CHEWABLE_TABLET | Freq: Three times a day (TID) | ORAL | Status: DC
Start: 2015-09-23 — End: 2015-09-26
  Administered 2015-09-23 – 2015-09-26 (×9): 80 mg via ORAL
  Filled 2015-09-23 (×10): qty 1

## 2015-09-23 MED ORDER — KETOROLAC TROMETHAMINE 30 MG/ML IJ SOLN: 30.0000 mg | Freq: Four times a day (QID) | INTRAMUSCULAR | Status: DC | PRN

## 2015-09-23 MED ORDER — PRENATAL MULTIVITAMIN CH
1.0000 | ORAL_TABLET | Freq: Every day | ORAL | Status: DC
  Administered 2015-09-23 – 2015-09-26 (×4): 1 via ORAL
  Filled 2015-09-23 (×4): qty 1

## 2015-09-23 MED ORDER — ONDANSETRON HCL 4 MG/2ML IJ SOLN: 4.0000 mg | Freq: Three times a day (TID) | INTRAMUSCULAR | Status: DC | PRN

## 2015-09-23 MED ORDER — SIMETHICONE 80 MG PO CHEW: 80.0000 mg | CHEWABLE_TABLET | ORAL | Status: DC | PRN

## 2015-09-23 MED ORDER — NALOXONE HCL 0.4 MG/ML IJ SOLN: 0.4000 mg | INTRAMUSCULAR | Status: DC | PRN

## 2015-09-23 MED ORDER — LANOLIN HYDROUS EX OINT: 1.0000 "application " | TOPICAL_OINTMENT | CUTANEOUS | Status: DC | PRN

## 2015-09-23 MED ORDER — MEPERIDINE HCL 25 MG/ML IJ SOLN
INTRAMUSCULAR | Status: DC | PRN
  Administered 2015-09-23 (×2): 12.5 mg via INTRAVENOUS

## 2015-09-23 MED ORDER — FENTANYL CITRATE (PF) 100 MCG/2ML IJ SOLN
25.0000 ug | INTRAMUSCULAR | Status: DC | PRN
  Administered 2015-09-23: 50 ug via INTRAVENOUS

## 2015-09-23 MED ORDER — ONDANSETRON HCL 4 MG/2ML IJ SOLN
INTRAMUSCULAR | Status: AC
  Filled 2015-09-23: qty 2

## 2015-09-23 MED ORDER — MEPERIDINE HCL 25 MG/ML IJ SOLN
INTRAMUSCULAR | Status: AC
  Filled 2015-09-23: qty 1

## 2015-09-23 MED ORDER — OXYTOCIN 40 UNITS IN LACTATED RINGERS INFUSION - SIMPLE MED: 62.5000 mL/h | INTRAVENOUS | Status: AC

## 2015-09-23 MED ORDER — SCOPOLAMINE 1 MG/3DAYS TD PT72
MEDICATED_PATCH | TRANSDERMAL | Status: AC
  Filled 2015-09-23: qty 1

## 2015-09-23 MED ORDER — SCOPOLAMINE 1 MG/3DAYS TD PT72
MEDICATED_PATCH | TRANSDERMAL | Status: DC | PRN
  Administered 2015-09-23: 1 via TRANSDERMAL

## 2015-09-23 MED ORDER — SODIUM CHLORIDE 0.9 % IJ SOLN: 3.0000 mL | INTRAMUSCULAR | Status: DC | PRN

## 2015-09-23 MED ORDER — MEPERIDINE HCL 25 MG/ML IJ SOLN
6.2500 mg | INTRAMUSCULAR | Status: AC | PRN
  Administered 2015-09-23 (×2): 6.25 mg via INTRAVENOUS

## 2015-09-23 MED ORDER — LACTATED RINGERS IV SOLN
INTRAVENOUS | Status: DC | PRN
  Administered 2015-09-23: 02:00:00 via INTRAVENOUS

## 2015-09-23 MED ORDER — LACTATED RINGERS IV SOLN
INTRAVENOUS | Status: DC
  Administered 2015-09-23: 09:00:00 via INTRAVENOUS

## 2015-09-23 MED ORDER — MORPHINE SULFATE (PF) 0.5 MG/ML IJ SOLN
INTRAMUSCULAR | Status: DC | PRN
  Administered 2015-09-23: 1 mg via EPIDURAL
  Administered 2015-09-23: 4 mg via EPIDURAL

## 2015-09-23 MED ORDER — SENNOSIDES-DOCUSATE SODIUM 8.6-50 MG PO TABS
2.0000 | ORAL_TABLET | ORAL | Status: DC
  Administered 2015-09-23 – 2015-09-26 (×3): 2 via ORAL
  Filled 2015-09-23 (×3): qty 2

## 2015-09-23 MED ORDER — MENTHOL 3 MG MT LOZG: 1.0000 | LOZENGE | OROMUCOSAL | Status: DC | PRN

## 2015-09-23 MED ORDER — DEXTROSE 5 % IV SOLN
3.0000 g | INTRAVENOUS | Status: DC | PRN
  Administered 2015-09-23: 3 g via INTRAVENOUS

## 2015-09-23 MED ORDER — BUPIVACAINE HCL (PF) 0.25 % IJ SOLN
INTRAMUSCULAR | Status: AC
  Filled 2015-09-23: qty 10

## 2015-09-23 MED ORDER — DIPHENHYDRAMINE HCL 25 MG PO CAPS: 25.0000 mg | ORAL_CAPSULE | Freq: Four times a day (QID) | ORAL | Status: DC | PRN

## 2015-09-23 MED ORDER — DIBUCAINE 1 % RE OINT: 1.0000 "application " | TOPICAL_OINTMENT | RECTAL | Status: DC | PRN

## 2015-09-23 MED ORDER — ONDANSETRON HCL 4 MG/2ML IJ SOLN
INTRAMUSCULAR | Status: DC | PRN
  Administered 2015-09-23: 4 mg via INTRAVENOUS

## 2015-09-23 MED ORDER — OXYCODONE-ACETAMINOPHEN 5-325 MG PO TABS
2.0000 | ORAL_TABLET | ORAL | Status: DC | PRN
  Administered 2015-09-23 – 2015-09-25 (×3): 2 via ORAL
  Filled 2015-09-23 (×3): qty 2

## 2015-09-23 MED ORDER — BISACODYL 10 MG RE SUPP: 10.0000 mg | Freq: Every day | RECTAL | Status: DC | PRN

## 2015-09-23 MED ORDER — IBUPROFEN 600 MG PO TABS
600.0000 mg | ORAL_TABLET | Freq: Four times a day (QID) | ORAL | Status: DC
  Administered 2015-09-23 – 2015-09-26 (×13): 600 mg via ORAL
  Filled 2015-09-23 (×13): qty 1

## 2015-09-23 MED ORDER — LABETALOL HCL 100 MG PO TABS
100.0000 mg | ORAL_TABLET | Freq: Two times a day (BID) | ORAL | Status: DC
  Administered 2015-09-23 – 2015-09-25 (×6): 100 mg via ORAL
  Filled 2015-09-23 (×6): qty 1

## 2015-09-23 MED ORDER — ZOLPIDEM TARTRATE 5 MG PO TABS: 5.0000 mg | ORAL_TABLET | Freq: Every evening | ORAL | Status: DC | PRN

## 2015-09-23 MED ORDER — LABETALOL HCL 5 MG/ML IV SOLN
INTRAVENOUS | Status: DC | PRN
  Administered 2015-09-23 (×2): 5 mg via INTRAVENOUS
  Administered 2015-09-23: 10 mg via INTRAVENOUS

## 2015-09-23 MED ORDER — OXYTOCIN 10 UNIT/ML IJ SOLN
INTRAMUSCULAR | Status: AC
  Filled 2015-09-23: qty 4

## 2015-09-23 MED ORDER — SODIUM BICARBONATE 8.4 % IV SOLN
INTRAVENOUS | Status: DC | PRN
  Administered 2015-09-23: 5 mL via EPIDURAL
  Administered 2015-09-23: 7 mL via EPIDURAL
  Administered 2015-09-23: 5 mL via EPIDURAL
  Administered 2015-09-23: 3 mL via EPIDURAL

## 2015-09-23 MED ORDER — BUPIVACAINE HCL (PF) 0.25 % IJ SOLN
INTRAMUSCULAR | Status: AC
  Filled 2015-09-23: qty 20

## 2015-09-23 MED ORDER — KETOROLAC TROMETHAMINE 30 MG/ML IJ SOLN
30.0000 mg | Freq: Four times a day (QID) | INTRAMUSCULAR | Status: DC | PRN
  Administered 2015-09-23: 30 mg via INTRAVENOUS

## 2015-09-23 MED ORDER — SODIUM CHLORIDE 0.9 % IJ SOLN: 3.0000 mL | Freq: Two times a day (BID) | INTRAMUSCULAR | Status: DC

## 2015-09-23 MED ORDER — SODIUM CHLORIDE 0.9 % IV SOLN: 250.0000 mL | INTRAVENOUS | Status: DC

## 2015-09-23 MED ORDER — FLEET ENEMA 7-19 GM/118ML RE ENEM: 1.0000 | ENEMA | Freq: Every day | RECTAL | Status: DC | PRN

## 2015-09-23 SURGICAL SUPPLY — 41 items
BARRIER ADHS 3X4 INTERCEED (GAUZE/BANDAGES/DRESSINGS) ×3 IMPLANT
BENZOIN TINCTURE PRP APPL 2/3 (GAUZE/BANDAGES/DRESSINGS) IMPLANT
CLAMP CORD UMBIL (MISCELLANEOUS) IMPLANT
CLOSURE WOUND 1/2 X4 (GAUZE/BANDAGES/DRESSINGS)
CLOTH BEACON ORANGE TIMEOUT ST (SAFETY) ×3 IMPLANT
CONTAINER PREFILL 10% NBF 15ML (MISCELLANEOUS) IMPLANT
DRAPE C SECTION CLR SCREEN (DRAPES) ×3 IMPLANT
DRAPE SHEET LG 3/4 BI-LAMINATE (DRAPES) IMPLANT
DRSG OPSITE POSTOP 4X10 (GAUZE/BANDAGES/DRESSINGS) ×3 IMPLANT
DURAPREP 26ML APPLICATOR (WOUND CARE) ×3 IMPLANT
ELECT REM PT RETURN 9FT ADLT (ELECTROSURGICAL) ×3
ELECTRODE REM PT RTRN 9FT ADLT (ELECTROSURGICAL) ×1 IMPLANT
EXTRACTOR VACUUM M CUP 4 TUBE (SUCTIONS) IMPLANT
EXTRACTOR VACUUM M CUP 4' TUBE (SUCTIONS)
GLOVE BIOGEL PI IND STRL 7.0 (GLOVE) ×1 IMPLANT
GLOVE BIOGEL PI INDICATOR 7.0 (GLOVE) ×2
GLOVE ECLIPSE 6.5 STRL STRAW (GLOVE) ×3 IMPLANT
GOWN STRL REUS W/TWL LRG LVL3 (GOWN DISPOSABLE) ×6 IMPLANT
KIT ABG SYR 3ML LUER SLIP (SYRINGE) IMPLANT
NEEDLE HYPO 22GX1.5 SAFETY (NEEDLE) ×3 IMPLANT
NEEDLE HYPO 25X5/8 SAFETYGLIDE (NEEDLE) IMPLANT
NS IRRIG 1000ML POUR BTL (IV SOLUTION) ×3 IMPLANT
PACK C SECTION WH (CUSTOM PROCEDURE TRAY) ×3 IMPLANT
PAD OB MATERNITY 4.3X12.25 (PERSONAL CARE ITEMS) ×3 IMPLANT
PENCIL SMOKE EVAC W/HOLSTER (ELECTROSURGICAL) ×3 IMPLANT
RTRCTR C-SECT PINK 25CM LRG (MISCELLANEOUS) IMPLANT
STRIP CLOSURE SKIN 1/2X4 (GAUZE/BANDAGES/DRESSINGS) IMPLANT
SUT CHROMIC GUT AB #0 18 (SUTURE) IMPLANT
SUT MNCRL 0 VIOLET CTX 36 (SUTURE) ×3 IMPLANT
SUT MON AB 4-0 PS1 27 (SUTURE) IMPLANT
SUT MONOCRYL 0 CTX 36 (SUTURE) ×6
SUT PLAIN 2 0 (SUTURE)
SUT PLAIN 2 0 XLH (SUTURE) IMPLANT
SUT PLAIN ABS 2-0 CT1 27XMFL (SUTURE) IMPLANT
SUT VIC AB 0 CT1 36 (SUTURE) ×6 IMPLANT
SUT VIC AB 2-0 CT1 27 (SUTURE) ×2
SUT VIC AB 2-0 CT1 TAPERPNT 27 (SUTURE) ×1 IMPLANT
SUT VIC AB 4-0 PS2 27 (SUTURE) IMPLANT
SYR CONTROL 10ML LL (SYRINGE) ×3 IMPLANT
TOWEL OR 17X24 6PK STRL BLUE (TOWEL DISPOSABLE) ×3 IMPLANT
TRAY FOLEY CATH SILVER 14FR (SET/KITS/TRAYS/PACK) IMPLANT

## 2015-09-23 NOTE — Progress Notes (Signed)
S: called by RN  pushing for 2 1/2 hrs with no descent   O: VE fully (+) 1 station  large caput   Tracing: baseline Deep variable decels Ctx q 2 mins BS 90's  IMP: arrest of descent  Class A2 GDM Term P) proceed with C/S. Risk of C/S reviewed: infection, bleeding, poss injury to surrounding Organ structures, poss blood transfusion and its risk( 1/million, fever, hepatitis). All ? answered

## 2015-09-23 NOTE — Brief Op Note (Signed)
09/22/2015 - 09/23/2015  3:18 AM  PATIENT:  Katherine Hood  39 y.o. female  PRE-OPERATIVE DIAGNOSIS:  Arrest of descent, Class A2 GDM, term gestation  POST-OPERATIVE DIAGNOSIS:  Arrest of descent, Class A2 GDM, term gestation, PIH, bilateral paratubal cysts  PROCEDURE:  Primary cesarean section, kerr hysterotomy, bilateral paratubal  cyst removal  SURGEON:  Surgeon(s) and Role:    * Maxie Better, MD - Primary  PHYSICIAN ASSISTANT:   ASSISTANTS: Raelyn Mora, CNM   ANESTHESIA:   epidural Findings: live female CANx1, LOP, nl ovaries, bilateral 2 cm paratubal cyst. 8lb 11 oz. apgars 7/8 EBL:  Total I/O In: 1000 [I.V.:1000] Out: 1075 [Urine:275; Emesis/NG output:300; Blood:500]  BLOOD ADMINISTERED:none  DRAINS: none   LOCAL MEDICATIONS USED:  MARCAINE     SPECIMEN:  Source of Specimen:  paratubal cysts  DISPOSITION OF SPECIMEN:  PATHOLOGY  COUNTS:  YES  TOURNIQUET:  * No tourniquets in log *  DICTATION: .Other Dictation: Dictation Number [id number]  PLAN OF CARE: Admit to inpatient   PATIENT DISPOSITION:  PACU - hemodynamically stable.   Delay start of Pharmacological VTE agent (>24hrs) due to surgical blood loss or risk of bleeding: no

## 2015-09-23 NOTE — Lactation Note (Signed)
This note was copied from the chart of Katherine Hood. Lactation Consultation Note New mom had C-section, GM, baby went to NICU d/t low glucose. Mom done STS. Baby BF prior to going to NICU. Hand expression taught w/noted colostrum. 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 Approximately 2ml colostrum hand expressed in vial and labeled for NICU. Mom has good everted nipples. Hx: PCOS, has adequate breast tissue.  Mom shown how to use DEBP & how to disassemble, clean, & reassemble parts. Mom knows to pump q3h for 15-20 min.  Referred to Baby and Me Book in Breastfeeding section Pg. 22-23 for position options and Proper latch demonstration. Educated about newborn behavior. Discussed I&O, supply, demand and Mom encouraged to do skin-to-skin. Mom encouraged to feed baby 8-12 times/24 hours and with feeding cues. WH/LC brochure given w/resources, support groups and LC services. NICU booklet given and reviewed difference in collecting colostrum.    Patient Name: Katherine Chrishana Spargur FAOZH'Y Date: 09/23/2015 Reason for consult: Initial assessment   Maternal Data Has patient been taught Hand Expression?: Yes Does the patient have breastfeeding experience prior to this delivery?: No  Feeding Feeding Type: Formula Nipple Type: Slow - flow Length of feed: 45 min  LATCH Score/Interventions Latch: Grasps breast easily, tongue down, lips flanged, rhythmical sucking.  Audible Swallowing: A few with stimulation Intervention(s): Hand expression  Type of Nipple: Everted at rest and after stimulation  Comfort (Breast/Nipple): Soft / non-tender     Hold (Positioning): Assistance needed to correctly position infant at breast and maintain latch.  LATCH Score: 8  Lactation Tools Discussed/Used Tools: Pump Breast pump type: Double-Electric Breast Pump Pump Review: Setup, frequency, and cleaning;Milk Storage Initiated by:: Peri Jefferson RN Date initiated:: 09/23/15   Consult Status Consult  Status: Follow-up Date: 09/24/15 Follow-up type: In-patient    Charyl Dancer 09/23/2015, 3:43 PM

## 2015-09-23 NOTE — Anesthesia Postprocedure Evaluation (Deleted)
  Anesthesia Post-op Note  Patient: Katherine Hood  Procedure(s) Performed: Procedure(s): CESAREAN SECTION (N/A)  Patient Location: PACU  Anesthesia Type:Epidural  Level of Consciousness: awake and alert   Airway and Oxygen Therapy: Patient Spontanous Breathing  Post-op Pain: mild  Post-op Assessment: Post-op Vital signs reviewed, Patient's Cardiovascular Status Stable, Respiratory Function Stable, No signs of Nausea or vomiting, Pain level controlled, No headache, Spinal receding and Patient able to bend at knees    Post-op Vital Signs: Reviewed  Last Vitals:  Filed Vitals:   09/23/15 0814  BP: 140/75  Pulse: 84  Temp: 36.4 C  Resp:     Complications: No apparent anesthesia complications

## 2015-09-23 NOTE — Progress Notes (Signed)
POSTOPERATIVE DAY # 0 S/P CS - arrest of descent  S:         Reports feeling ok - really tired              Tolerating po clear intake / no nausea / no vomiting / no flatus / no BM             Bleeding is light             Pain controlled with narcotic medicine             Newborn breast-feeding  / Circumcision planned  O:  VS: BP 140/75 mmHg  Pulse 84  Temp(Src) 97.6 F (36.4 C) (Axillary)  Resp 18  Ht 5' 7.5" (1.715 m)  Wt 122.471 kg (270 lb)  BMI 41.64 kg/m2  SpO2 98%  LMP 12/16/2014  Breastfeeding? Unknown             Physical Exam:             Alert and Oriented X3, lethargic and sleepy  Abdomen: soft, non-tender, non-distended             Fundus: firm, non-tender, Ueven             Dressing intact        A:        POD # 0 S/P CS             P:        Routine postoperative care                 Marlinda Mike CNM, MSN, [facility] 09/23/2015, 10:03 AM

## 2015-09-23 NOTE — Anesthesia Postprocedure Evaluation (Signed)
Anesthesia Post Note  Patient: Katherine Hood  Procedure(s) Performed: Procedure(s) (LRB): CESAREAN SECTION (N/A)  Anesthesia type: Epidural  Patient location: Mother/Baby  Post pain: Pain level controlled  Post assessment: Post-op Vital signs reviewed  Last Vitals:  Filed Vitals:   09/23/15 0814  BP: 140/75  Pulse: 84  Temp: 36.4 C  Resp:     Post vital signs: Reviewed  Level of consciousness:alert  Complications: No apparent anesthesia complications

## 2015-09-23 NOTE — Transfer of Care (Signed)
Immediate Anesthesia Transfer of Care Note  Patient: Katherine Hood  Procedure(s) Performed: Procedure(s): CESAREAN SECTION (N/A)  Patient Location: PACU  Anesthesia Type:Regional  Level of Consciousness: awake, alert  and oriented  Airway & Oxygen Therapy: Patient Spontanous Breathing  Post-op Assessment: Report given to RN and Post -op Vital signs reviewed and stable  Post vital signs: Reviewed and stable  Last Vitals:  Filed Vitals:   09/23/15 0131  BP: 178/84  Pulse: 105  Temp:   Resp:     Complications: No apparent anesthesia complications

## 2015-09-24 DIAGNOSIS — O139 Gestational [pregnancy-induced] hypertension without significant proteinuria, unspecified trimester: Secondary | ICD-10-CM

## 2015-09-24 HISTORY — DX: Gestational (pregnancy-induced) hypertension without significant proteinuria, unspecified trimester: O13.9

## 2015-09-24 MED ORDER — INFLUENZA VAC SPLIT QUAD 0.5 ML IM SUSY: 0.5000 mL | PREFILLED_SYRINGE | INTRAMUSCULAR | Status: DC | PRN

## 2015-09-24 NOTE — Progress Notes (Addendum)
POSTOPERATIVE DAY # 1 S/P CS - arrest of descent  S:         Reports feeling ok - tired and little sore             Tolerating po intake / no nausea / no vomiting / + flatus / no BM             Bleeding is light             Pain controlled with motrin and percocet             Up ad lib / ambulatory/ voiding QS  Newborn breast feeding & pumping   O:  VS: BP 120/67 mmHg  Pulse 79  Temp(Src) 98 F (36.7 C) (Oral)  Resp 17  Ht 5' 7.5" (1.715 m)  Wt 122.471 kg (270 lb)  BMI 41.64 kg/m2  SpO2 98%  LMP 12/16/2014  Breastfeeding? Unknown   LABS:               Recent Labs  09/22/15 0810 09/23/15 0836  WBC 9.3 27.5*  HGB 11.9* 11.4*  PLT 246 224               Bloodtype: --/--/A POS (09/23 0810)  Rubella: Immune (02/25 0000)                 tdap 2016 /  No flu vaccine                                   I&O: Net NEGATIVE             Physical Exam:             Alert and Oriented X3  Lungs: Clear and unlabored  Heart: regular rate and rhythm / no mumurs  Abdomen: soft, non-tender, mildly distended, hypoactive BS             Fundus: firm, non-tender, Ueven             Dressing intact honeycomb              Incision:  no erythema / no ecchymosis / no drainage  Lochia: light  Extremities: 1+ pedal edema, no calf pain or tenderness, negative Homans  A:        POD # 1 S/P CS            GDMa2 - hx PCOS            PIH - gestational hypertension  P:        Routine postoperative care              DC IV today - shower and ambulate             Continue labetalol and HCTZ initiated PPD#0             BP check 1 week at office after DC             GTT 6-12 weeks PP          Marlinda Mike CNM, MSN, [facility] [place] 09/24/2015, 9:55 AM

## 2015-09-25 ENCOUNTER — Encounter (HOSPITAL_COMMUNITY): Payer: Self-pay | Admitting: Obstetrics and Gynecology

## 2015-09-25 NOTE — Progress Notes (Signed)
CSW attempted to meet with MOB in her first floor room to introduce services, offer support and complete assessment due to NICU admission, but she was in the bathroom at this time.  CSW offered to return at a later time and MOB agreed. 

## 2015-09-25 NOTE — Progress Notes (Signed)
POSTOPERATIVE DAY # 2 S/P CS - arrest of descent  S:         Reports feeling much better after a BM             Tolerating po intake / no nausea / no vomiting / (+) flatus / (+) BM             Bleeding is light             Pain controlled with motrin and percocet             Up ad lib / ambulatory/ voiding QS  Newborn breast feeding & pumping   O:  VS: BP 149/82 mmHg  Pulse 78  Temp(Src) 97.6 F (36.4 C) (Oral)  Resp 17  Ht 5' 7.5" (1.715 Katherine Hood)  Wt 122.471 kg (270 lb)  BMI 41.64 kg/m2  SpO2 98%  LMP 12/16/2014  Pumping Colostrum    LABS:                Recent Labs  09/23/15 0836  WBC 27.5*  HGB 11.4*  PLT 224               Bloodtype: --/--/A POS (09/23 0810)  Rubella: Immune (02/25 0000)                 tdap 2016 /  No flu vaccine                               Physical Exam:             Alert and Oriented x 3  Lungs: Clear and unlabored  Heart: regular rate and rhythm / no mumurs  Abdomen: soft, non-tender, mildly distended, hypoactive BS             Fundus: firm, non-tender, U-1             Dressing intact honeycomb              Incision:  no erythema / no ecchymosis / no drainage  Lochia: light  Extremities: Trace pedal edema, no calf pain or tenderness, negative Homans  A:        POD # 2 S/P CS            GDM-A2 - hx PCOS            PIH - gestational hypertension  P:        Routine postoperative care              Continue ambulation TID in hallways  Offer Flu vaccine prior to D/C home             Continue labetalol and HCTZ initiated PPD#0             BP check 1 week at office after DC             GTT 6-12 weeks PP          Katherine Hood, Katherine Hood, Katherine Hood CNM, MSN, [facility] 09/25/2015, 12:20 PM

## 2015-09-25 NOTE — Op Note (Signed)
[name], Katherine Hood NO.:  [id number]  MEDICAL RECORD NO.:  [id number]  LOCATION:  9130                          FACILITY:  [facility]  PHYSICIAN:  Servando Salina, M.D.DATE OF BIRTH:  06-12-1976  DATE OF PROCEDURE:  09/23/2015 DATE OF DISCHARGE:                              OPERATIVE REPORT   PREOPERATIVE DIAGNOSES: 1. Arrest of descent. 2. Class A2 gestational diabetes. 3. Term gestation.  PROCEDURES: 1. Primary cesarean section, Kerr hysterotomy. 2. Bilateral paratubal cyst removal.  POSTOPERATIVE DIAGNOSES: 1. Bilateral paratubal cyst. 2. Arrest of descent. 3. Left occiput posterior presentation. 4. Term gestation. 5. Class A2 gestational diabetes  ANESTHESIA:  Epidural.  SURGEON:  Servando Salina, M.D.  ASSISTANT:  Hester Mates, CNM.  SPECIMENS:  Placenta was not sent to Pathology, and bilateral parietal tubal cyst were sent to Pathology.  ESTIMATED BLOOD LOSS:  500 mL.  INTRAOPERATIVE FLUID:  1 L.  URINE OUTPUT:  75 mL.  COUNTS:  Sponge and instrument counts x2 was correct.  COMPLICATIONS:  None.  DESCRIPTION OF PROCEDURE:  Under adequate epidural anesthesia, the patient was placed in a supine position with left lateral tilt.  An indwelling Foley catheter was replaced in the operating room.  It was met with some resistance due to the low vertex presentation. Nonetheless, the patient was sterilely prepped and draped in usual fashion.  A 0.25% Marcaine was injected along the planned Pfannenstiel skin incision site.  A Pfannenstiel skin incision was then made, carried down to the rectus fascia.  The rectus fascia was opened transversely. Rectus fascia was then bluntly and sharply dissected off the rectus muscle in superior and inferior fashion.  The rectus muscle was split in the midline.  The parietal peritoneum was entered bluntly and extended. A self-retaining Alexis retractor was then placed.  The vesicouterine peritoneum was then  opened transversely.  The bladder was then bluntly dissected off the lower uterine segment and displaced inferiorly.  A curvilinear low transverse incision was then made and extended with bandage scissors.  Meconium-stained fluid was noted, admixed with amnioinfusion fluid.  A live female was found in the left occiput posterior presentation with a large caput.  He was displaced, brought up into the field, bulb suctioned on the abdomen.  Cord was clamped and cut.  The baby was transferred to the awaiting pediatricians who assigned Apgars of 7 and 8 at 1 and 5 minutes.  The placenta was manually removed.  Uterine cavity was cleaned of debris.  Uterine incision had no extension, it was closed in 2 layers, the first layer with 0 Monocryl running locked stitch, second layer was imbricated using 0 Monocryl suture.  Abdomen was then irrigated and suctioned of debris. The Alexis retractor was removed.  Interceed was placed over the lower uterine segment in an inverted T fashion.  Parietal peritoneum was closed with 2-0 Vicryl.  Rectus fascia was closed with 0 Vicryl x2.  The subcutaneous area was irrigated.  Small bleeders were cauterized. Interrupted 2-0 plain sutures were placed, and the skin was approximated with Ethicon staples.  DISPOSITION:  The baby was placed skin-to-skin.  The patient tolerated the procedure well and was transferred to Recovery in stable condition.  Servando Salina, M.D.     [place]/MEDQ  D:  09/23/2015  T:  09/23/2015  Job:  [id number]

## 2015-09-25 NOTE — Lactation Note (Signed)
This note was copied from the chart of Katherine Hood. Lactation Consultation Note  Patient Name: Katherine Hanaa Payes [id number] Date: 09/25/2015 Reason for consult: Follow-up assessment;NICU baby NICU baby 56 hours old. Mom reports that she has been pumping every 3 hours. Enc mom to pump 8 times/24 hours for 15 minutes followed by hand expression. Demonstrated hand expression to mom--mom states that she was "out of it" when assisted earlier. Reviewed NICU booklet and LC brochure with mom. Mom aware of EBM storage guidelines, OP/BFSG, community resources, and [facility] phone line assistance after D/C. Enc parents to take EBM to baby as it is collected. Mom aware of pumping rooms in NICU. Mom states that she has a DEBP at home. Enc mom to offer STS/Kangaroo care and nuzzling/latching as baby able. Enc parents to call for assistance as needed.   Maternal Data Has patient been taught Hand Expression?: Yes  Feeding Feeding Type: Formula Nipple Type: Slow - flow Length of feed: 60 min  LATCH Score/Interventions                      Lactation Tools Discussed/Used     Consult Status Consult Status: Follow-up Date: 09/26/15 Follow-up type: In-patient    Geralynn Ochs 09/25/2015, 12:44 PM

## 2015-09-26 MED ORDER — LABETALOL HCL 200 MG PO TABS
200.0000 mg | ORAL_TABLET | Freq: Two times a day (BID) | ORAL | Status: DC
  Administered 2015-09-26: 200 mg via ORAL
  Filled 2015-09-26: qty 1

## 2015-09-26 MED ORDER — POLYSACCHARIDE IRON COMPLEX 150 MG PO CAPS: 150.0000 mg | ORAL_CAPSULE | Freq: Every day | ORAL | Status: DC

## 2015-09-26 MED ORDER — HYDROCHLOROTHIAZIDE 25 MG PO TABS: 25.0000 mg | ORAL_TABLET | Freq: Every day | ORAL | Status: DC

## 2015-09-26 MED ORDER — POLYSACCHARIDE IRON COMPLEX 150 MG PO CAPS
150.0000 mg | ORAL_CAPSULE | Freq: Every day | ORAL | Status: DC
Start: 2015-09-26 — End: 2015-09-26

## 2015-09-26 MED ORDER — OXYCODONE-ACETAMINOPHEN 5-325 MG PO TABS: 1.0000 | ORAL_TABLET | ORAL | Status: DC | PRN

## 2015-09-26 MED ORDER — IBUPROFEN 600 MG PO TABS: 600.0000 mg | ORAL_TABLET | Freq: Four times a day (QID) | ORAL | Status: DC

## 2015-09-26 NOTE — Clinical Social Work Maternal (Signed)
CLINICAL SOCIAL WORK MATERNAL/CHILD NOTE  Patient Details  Name: Katherine Hood MRN: [id number] Date of Birth: 07/22/1976  Date:  09/26/2015  Clinical Social Worker Initiating Note:  Katherine Roseman E. Thomson Herbers, LCSW Date/ Time Initiated:  09/26/15/1000     Child's Name:  Katherine Hood    Legal Guardian:   (Parents: Katherine Hood and Katherine Hood)   Need for Interpreter:  None   Date of Referral:        Reason for Referral:   (No referral-NICU admission)   Referral Source:      Address:  [address] [place], [place] [address]  Phone number:  [phone]   Household Members:      Natural Supports (not living in the home):  Extended Family, Immediate Family (MOB reports having a good support system.  She states her husband, mother and mother-in-law are her greatest support people.)   Professional Supports:     Employment: Full-time   Type of Work:  (MOB works for [employer] and plans to take Short Term Disability for 26 weeks.  FOB works for [employer].)   Education:      Financial Resources:  Private Insurance   Other Resources:      Cultural/Religious Considerations Which May Impact Care: None stated.  MOB's facesheet notes religion as Non-Denominational.  Strengths:  Ability to meet basic needs , Compliance with medical plan , Home prepared for child , Understanding of illness, Pediatrician chosen  (Pediatric follow up will be with Dr. Quinlan.)   Risk Factors/Current Problems:  None   Cognitive State:  Alert , Linear Thinking , Goal Oriented , Insightful    Mood/Affect:  Interested , Comfortable , Calm , Relaxed    CSW Assessment: CSW met with MOB in her first floor room/130 to introduce services, offer support, and complete assessment due to baby's admission to NICU.  MOB was pleasant and welcoming of CSW's visit.  She reports feeling well and provided CSW with an update on baby.  She states he took a whole bottle over night, to which she is very pleased.  She seems  to have a good understanding of baby's medical needs and states feeling well informed and comfortable with his care.  She is unsure if she will be discharged today or not, but states an understanding that he may need to remain in the hospital longer than she does.  She states an eagerness to get home to her own bed to rest and states no issues with transportation to get back and forth from her home to the hospital.   MOB spoke at length about her labor and delivery and seemed appreciative of the space to share her story.  She appears to be coping well with baby's admission to NICU and reports no emotional concerns at this time.  CSW provided information regarding signs and symptoms of perinatal mood disorders.  MOB was easily engaged and attentive.  CSW also discussed common emotions often experienced in the first two weeks following delivery.  CSW encouraged MOB to talk with her doctor and or CSW if she has concerns about her mental/emotional health at any time.   MOB reports having all necessary baby supplies at home for infant.  CSW provided education on SIDS precautions.  MOB stated understanding and a commitment to putting baby to bed in his own sleep environment one hundred percent of the time.   CSW explained ongoing support services offered by NICU CSW and gave contact information, encouraging MOB to call any   time.  MOB stated appreciation for CSW's visit.  CSW Plan/Description:  Patient/Family Education , Psychosocial Support and Ongoing Assessment of Needs    Katherine Lorimer Elizabeth, LCSW 09/26/2015, 12:36 PM 

## 2015-09-26 NOTE — Progress Notes (Signed)
POD # 3  Subjective: Pt reports feeling ok, breast are sore/ Pain controlled with Motrin and Percocet Tolerating po/Voiding without problems/ No n/v/ Flatus present, +BM No HA, visual disturbance or epigastric pain Activity: ad lib Bleeding is light Newborn info:  Information for the patient's newborn:  Katherine Hood, Katherine Hood [[id number]]  female  / Circumcision: planning/ Feeding: breast and bottle  Objective: VS: VS:  Filed Vitals:   09/25/15 1009 09/25/15 1840 09/25/15 2250 09/26/15 0552  BP: 149/82 157/83 165/88 162/84  Pulse: 78 77 83 81  Temp:    98 F (36.7 C)  TempSrc:      Resp:  18  18  Height:      Weight:      SpO2:        I&O: Intake/Output    None     LABS: No results for input(s): WBC, HGB, PLT in the last 72 hours. Blood type: --/--/A POS (09/23 0810) Rubella: Immune (02/25 0000)           Physical Exam:  General: alert, cooperative and no distress CV: Regular rate and rhythm Resp: CTA bilaterally Abdomen: soft, nontender, normal bowel sounds Incision: Covered with Tegaderm and honeycomb dressing; no significant drainage, edema, bruising, or erythema; well approximated with staples Uterine Fundus: firm, below umbilicus, nontender Lochia: minimal Ext: edema 1+BLE and Homans sign is negative, no sign of DVT   Assessment: POD # 3/ G2P1010/ S/P C/Section d/t arrest of descent  Dependent edema A2GDM, delivered Gestational HTN, delivered Doing well and stable for discharge home  Plan: BPs elevated, increase Labetalol this am Consider discharge later today RX's: Ibuprofen  po Q 6 hrs prn pain #30 Refill x 1 Percocet 5/325 1 - 2 tabs po every 4 hrs prn pain #30 Refill x 0 Niferex  po QD #30 / BID #60 Refill x 1 HCTZ 25 mg po daily x3 more days, no refill Follow up in 6 wks for postpartum check and GTT at [facility] Follow up in 3 days for BP check and staple removal at [facility] booklet given    Signed: Donette Larry, Dorris Carnes, MSN,  CNM 09/26/2015, 9:43 AM

## 2015-09-26 NOTE — Lactation Note (Signed)
This note was copied from the chart of Katherine Felice Tennant. Lactation Consultation Note Follow up with mom prior to D/C. Infant is in NICU with Blood Glucose issues. Mom to be D/C today. Mom has been pumping q 2-3 hours with DEBP. She has received a few gtts. Enc her to take all amounts to baby in NICU. Mom is engorged today. She has ice packs on now, post pumping. Gave mom Engorgement Protocol Handout and enc her to pump post icing breasts. Mom was concerned that pump not working properly. Explained physiology of Engorgement and need for ice prior to pumping to aid in decreasing swelling and to aid in increasing milk flow. Mom is taking Motrin also. Enc. Mo mto massage/compress also. Mom has been shown to hand express and voiced that she was unable to get milk out then also. Mom voiced understanding of steps to relieve engorgement. She is aware of OP lactation services, Support Groups and BF Resources. She is aware she can call when in NICU for assistance as needed. Mom has Medela DEBP at home and knows she can use the DEBP when visiting NICU. She has bottles for storage and labels and knows to lable with date and time of pumping. Tols mom to call prn assistance/questions/concerns.   Patient Name: Katherine Hood [id number] Date: 09/26/2015 Reason for consult: Follow-up assessment;NICU baby;Other (Comment) (Engorgement)   Maternal Data    Feeding Feeding Type: Formula Nipple Type: Regular Length of feed: 15 min  LATCH Score/Interventions                      Lactation Tools Discussed/Used     Consult Status Consult Status: PRN Follow-up type: Call as needed    Ed Blalock 09/26/2015, 1:36 PM

## 2015-09-26 NOTE — Discharge Summary (Signed)
DISCHARGE SUMMARY:  Patient ID: Katherine Hood MRN: [id number] DOB/AGE: 38-Oct-1977 38 y.o.  Admit date: 09/22/2015 Admission Diagnoses: [redacted] weeks gestation, A2GDM   Discharge date: 09/26/2015 Discharge Diagnoses: S/P C/S on 09/23/15; A2GDM, delivered; Gestational HTN, delivered; Dependent edema, ABL anemia       Prenatal history: G2P1010   EDC: 09/22/2015, by Last Menstrual Period  Prenatal care at [facility] since [redacted] wks gestation. Primary provider: Dr. Cherly Hensen Prenatal course complicated by AMA, gHTN, A2GDM, obesity, previous ectopic.    Prenatal labs: ABO, Rh: --/--/A POS (09/23 0810)  Antibody: NEG (09/23 0810) Rubella:   Immune RPR: Non Reactive (09/23 0810)  HBsAg: Negative (02/25 0000)  HIV: Non-reactive (02/25 0000)  GBS: Negative (09/01 0000)  GTT: 168, failed 3hr  Medical / Surgical History :  Past medical history:  Past Medical History  Diagnosis Date  . Medical history non-contributory   . Gestational diabetes mellitus, antepartum   . PCOS (polycystic ovarian syndrome)   . Gestational diabetes   . Gestational diabetes mellitus 08/23/2015  . Hx of varicella   . Infertility, female   . Hyperprolactinemia   . Postpartum care following cesarean delivery (9/24) 09/23/2015    Past surgical history:  Past Surgical History  Procedure Laterality Date  . Hernia repair    . Cesarean section N/A 09/23/2015    Procedure: CESAREAN SECTION;  Surgeon: Maxie Better, MD;  Location: [facility];  Service: Obstetrics;  Laterality: N/A;     Medications on Admission: Prescriptions prior to admission  Medication Sig Dispense Refill Last Dose  . acetaminophen (TYLENOL) 325 MG tablet Take 650 mg by mouth every 6 (six) hours as needed for mild pain.   09/21/2015 at Unknown time  . glyBURIDE (DIABETA) 2.5 MG tablet Take 2.5 mg by mouth daily with breakfast. One tab  in morning and 2 tablets at dinner.   09/21/2015 at Unknown time  . Prenatal Vit-Fe  Fumarate-FA (MULTIVITAMIN-PRENATAL) 27-0.8 MG TABS tablet Take 1 tablet by mouth daily at 12 noon.   09/22/2015 at Unknown time  . zolpidem (AMBIEN) 10 MG tablet Take 10 mg by mouth at bedtime as needed for sleep.   09/21/2015 at Unknown time    Allergies: Review of patient's allergies indicates no known allergies.   Intrapartum Course:  Admitted for IOL, Pitocin, epidural, AROM-thin MSF, 2.5 hrs active second stage, arrest of descent, primary CS.   Postpartum Course: Complicated by ABL anemia, dependent edema  Physical Exam:   VSS: Blood pressure 145/78, pulse 80, temperature 97.3 F (36.3 C), temperature source Axillary, resp. rate 18, height 5' 7.5" (1.715 m), weight 122.471 kg (270 lb), last menstrual period 12/16/2014, SpO2 98 %, unknown if currently breastfeeding.  LABS: No results for input(s): WBC, HGB, PLT in the last 72 hours.  General: alert and oriented x3 Heart: RRR Lungs: CTA bilaterally GI: soft, non-tender, non-distended, BS x4 Lochia: small Uterus: firm below umbilicus Incision: well approximated with staples; honeycomb dressing-no significant erythema, drainage, or edema Extremities: 1+ BLE edema, Homans neg   Newborn Data Live born female  Birth Weight: 8 lb 11.3 oz (3949 g) APGAR: 7, 8  See operative report for further details  NICU  Discharge Instructions:  Wound Care: keep clean and dry / remove honeycomb POD 6 Postpartum Instructions: [facility] discharge booklet - instructions reviewed Medications:    Medication List    STOP taking these medications        acetaminophen 325 MG tablet  Commonly known as:  TYLENOL  glyBURIDE 2.5 MG tablet  Commonly known as:  DIABETA     zolpidem 10 MG tablet  Commonly known as:  AMBIEN      TAKE these medications        hydrochlorothiazide 25 MG tablet  Commonly known as:  HYDRODIURIL  Take 1 tablet (25 mg total) by mouth daily.     ibuprofen 600 MG tablet  Commonly known as:  ADVIL,MOTRIN  Take 1  tablet (600 mg total) by mouth every 6 (six) hours.     iron polysaccharides 150 MG capsule  Commonly known as:  NIFEREX  Take 1 capsule (150 mg total) by mouth daily.     multivitamin-prenatal 27-0.8 MG Tabs tablet  Take 1 tablet by mouth daily at 12 noon.     oxyCODONE-acetaminophen 5-325 MG tablet  Commonly known as:  PERCOCET/ROXICET  Take 1-2 tablets by mouth every 4 (four) hours as needed (for pain scale greater than 7).            Follow-up Information    Follow up with COUSINS,SHERONETTE A, MD. Schedule an appointment as soon as possible for a visit in 3 days.   Specialty:  Obstetrics and Gynecology   Why:  staple removal and blood pressure check   Contact information:   [address] [place] [facility] [address] [phone]       Follow up with COUSINS,SHERONETTE A, MD. Schedule an appointment as soon as possible for a visit in 6 weeks.   Specialty:  Obstetrics and Gynecology   Contact information:   [address] [place] [address] [phone]         Signed: Donette Larry, Dorris Carnes MSN, CNM 09/26/2015, 2:42 PM

## 2016-08-16 ENCOUNTER — Encounter (HOSPITAL_COMMUNITY): Payer: Self-pay

## 2016-08-16 ENCOUNTER — Emergency Department (HOSPITAL_COMMUNITY)
Admission: EM | Admit: 2016-08-16 | Discharge: 2016-08-16 | Disposition: A | Discharge status: Home / Self Care | Payer: 59 | Attending: Emergency Medicine | Admitting: Emergency Medicine

## 2016-08-16 DIAGNOSIS — Z791 Long term (current) use of non-steroidal anti-inflammatories (NSAID): Secondary | ICD-10-CM | POA: Insufficient documentation

## 2016-08-16 DIAGNOSIS — Z79899 Other long term (current) drug therapy: Secondary | ICD-10-CM | POA: Insufficient documentation

## 2016-08-16 DIAGNOSIS — M79651 Pain in right thigh: Secondary | ICD-10-CM

## 2016-08-16 MED ORDER — NAPROXEN 500 MG PO TABS: 500.0000 mg | ORAL_TABLET | Freq: Once | ORAL | Status: DC

## 2016-08-16 MED ORDER — METHOCARBAMOL 500 MG PO TABS: 500.0000 mg | ORAL_TABLET | Freq: Two times a day (BID) | ORAL | 0 refills | Status: DC

## 2016-08-16 MED ORDER — NAPROXEN 500 MG PO TABS: 500.0000 mg | ORAL_TABLET | Freq: Two times a day (BID) | ORAL | 0 refills | Status: DC

## 2016-08-16 NOTE — ED Triage Notes (Signed)
PT C/O RIGHT THIGH PAIN AND SWELLING X2 WEEKS DURING A WORK-OUT SESSION. PT STS SHE HAS BEEN USING IBUPROFEN AND ICY- HOT WITH NO RELIEF.

## 2016-08-16 NOTE — ED Provider Notes (Signed)
[facility] Provider Note   CSN: [id number] Arrival date & time: 08/16/16  1922  By signing my name below, I, Gasper Sellsobert Ryan FriscoHalas, attest that this documentation has been prepared under the direction and in the presence of Noelle PennerSerena Nomi Rudnicki, [place]-C. Electronically Signed: Javier Dockerobert Ryan Halas, ER Scribe. 08/10/2016. 8:34 PM.  History   Chief Complaint Chief Complaint  Patient presents with  . Leg Pain    RIGHT THIGH   The history is provided by the patient. No language interpreter was used.    HPI Comments: Katherine Hood is a 40 y.o. female who presents to the Emergency Department complaining of right thigh pain for three weeks with mild associated swelling that started initially after working out three weeks ago. She has continued to work out over the last three weeks and her pain worsened last night after a workout. She has been taking ibuprofen and tylenol for her pain with minimal relief. She does not smoke and she is not on birth control. She denies chest pain or difficulty breathing. Denies calf pain or swelling. Denies h/o blood clots. Denies other pain in her body. Denies fever, chills, weakness.   Past Medical History:  Diagnosis Date  . Gestational diabetes   . Gestational diabetes mellitus 08/23/2015  . Gestational diabetes mellitus, antepartum   . Hx of varicella   . Hyperprolactinemia (HCC)   . Infertility, female   . Medical history non-contributory   . PCOS (polycystic ovarian syndrome)   . Postpartum care following cesarean delivery (9/24) 09/23/2015    Patient Active Problem List   Diagnosis Date Noted  . PIH (pregnancy induced hypertension) 09/24/2015  . Postpartum care following cesarean delivery (9/24) 09/23/2015  . GDM, class A2 09/22/2015    Past Surgical History:  Procedure Laterality Date  . CESAREAN SECTION N/A 09/23/2015   Procedure: CESAREAN SECTION;  Surgeon: Maxie BetterSheronette Cousins, MD;  Location: [facility];  Service: Obstetrics;  Laterality: N/A;  . HERNIA  REPAIR      OB History    Gravida Para Term Preterm AB Living   2 1 1   1  0   SAB TAB Ectopic Multiple Live Births       1 0         Home Medications    Prior to Admission medications   Medication Sig Start Date End Date Taking? Authorizing Provider  hydrochlorothiazide (HYDRODIURIL) 25 MG tablet Take 1 tablet (25 mg total) by mouth daily. 09/26/15   Donette LarryMelanie Bhambri, CNM  ibuprofen (ADVIL,MOTRIN) 600 MG tablet Take 1 tablet (600 mg total) by mouth every 6 (six) hours. 09/26/15   Donette LarryMelanie Bhambri, CNM  iron polysaccharides (NIFEREX) 150 MG capsule Take 1 capsule (150 mg total) by mouth daily. 09/26/15   Donette LarryMelanie Bhambri, CNM  methocarbamol (ROBAXIN) 500 MG tablet Take 1 tablet (500 mg total) by mouth 2 (two) times daily. 08/16/16   Ace GinsSerena Y Kelsie Zaborowski, PA-C  naproxen (NAPROSYN) 500 MG tablet Take 1 tablet (500 mg total) by mouth 2 (two) times daily. 08/16/16   Carlene CoriaSerena Y Zendayah Hardgrave, PA-C  oxyCODONE-acetaminophen (PERCOCET/ROXICET) 5-325 MG tablet Take 1-2 tablets by mouth every 4 (four) hours as needed (for pain scale greater than 7). 09/26/15   Donette LarryMelanie Bhambri, CNM  Prenatal Vit-Fe Fumarate-FA (MULTIVITAMIN-PRENATAL) 27-0.8 MG TABS tablet Take 1 tablet by mouth daily at 12 noon.    Historical Provider, MD    Family History Family History  Problem Relation Age of Onset  . Diabetes Mother   . Cancer Mother   .  Heart disease Mother   . Diabetes Father   . Endometriosis Sister   . Hypertension Maternal Grandmother     Social History Social History  Substance Use Topics  . Smoking status: Never Smoker  . Smokeless tobacco: Never Used  . Alcohol use No     Allergies   Review of patient's allergies indicates no known allergies.   Review of Systems Review of Systems At least 10pt or greater review of systems completed and are negative except where specified in the HPI.  Physical Exam Updated Vital Signs BP 137/76 (BP Location: Left Arm)   Pulse 65   Temp 98.2 F (36.8 C) (Oral)   Resp 17    Ht 5\' 8"  (1.727 m)   Wt 257 lb (116.6 kg)   LMP 08/15/2016   SpO2 98%   BMI 39.08 kg/m   Physical Exam  Constitutional: She is oriented to person, place, and time. She appears well-developed and well-nourished. No distress.  HENT:  Head: Normocephalic and atraumatic.  Eyes: Pupils are equal, round, and reactive to light.  Neck: Neck supple.  Cardiovascular: Normal rate.   Pulmonary/Chest: Effort normal. No respiratory distress.  Musculoskeletal: Normal range of motion.  Right thigh minimally ttp No calf tenderness No LE edema 2+DP and PT bilaterally  Neurological: She is alert and oriented to person, place, and time. Coordination normal.  5/5 strength throughout Moves all extremities freely Steady gait  Skin: Skin is warm and dry. She is not diaphoretic.  Psychiatric: She has a normal mood and affect. Her behavior is normal.  Nursing note and vitals reviewed.   ED Treatments / Results  DIAGNOSTIC STUDIES: Oxygen Saturation is 98% on RA, normal by my interpretation.    COORDINATION OF CARE: 8:43 PM Discussed treatment plan with pt at bedside which includes outpatient [place] and main medication and pt agreed to plan.  Labs (all labs ordered are listed, but only abnormal results are displayed) Labs Reviewed - No data to display  EKG  EKG Interpretation None       Radiology No results found.  Procedures Procedures (including critical care time)  Medications Ordered in ED Medications  naproxen (NAPROSYN) tablet 500 mg (not administered)     Initial Impression / Assessment and Plan / ED Course  I have reviewed the triage vital signs and the nursing notes.  Pertinent labs & imaging results that were available during my care of the patient were reviewed by me and considered in my medical decision making (see chart for details).  Clinical Course   Suspect muscular strain. Had been doing zumba and personal training, pain after one session and has continued to  work out since. Doubt rhabdo. Low suspicion for DVT. However due to pt concern I did order an outpatient DVT study at [facility] for tomorrow AM. Robaxin and naproxen prescribed. Encouraged ortho f/u and break from working out.   Final Clinical Impressions(s) / ED Diagnoses   Final diagnoses:  Right thigh pain    New Prescriptions New Prescriptions   METHOCARBAMOL (ROBAXIN) 500 MG TABLET    Take 1 tablet (500 mg total) by mouth 2 (two) times daily.   NAPROXEN (NAPROSYN) 500 MG TABLET    Take 1 tablet (500 mg total) by mouth 2 (two) times daily.     I personally performed the services described in this documentation, which was scribed in my presence. The recorded information has been reviewed and is accurate.      Carlene Coria, PA-C 08/16/16 2059  Pricilla LovelessScott Goldston, MD 08/17/16 [phone]

## 2016-08-16 NOTE — Discharge Instructions (Signed)
IMPORTANT PATIENT INSTRUCTIONS:  You have been scheduled for an Outpatient Vascular Study at [facility].   ° °If tomorrow is a Saturday or Sunday, please go to the [place] [facility] Registration Desk at 8 am tomorrow morning and tell them you are there for a vascular study.  If tomorrow is a weekday (Monday-Friday), please go to [place] Admitting Department at 8 am and tell them you are there for a vascular study. °

## 2016-08-17 ENCOUNTER — Emergency Department (HOSPITAL_COMMUNITY): Admission: EM | Admit: 2016-08-17 | Discharge: 2016-08-17 | Discharge status: Absent without leave | Payer: 59

## 2016-08-17 ENCOUNTER — Ambulatory Visit (HOSPITAL_COMMUNITY)
Admission: RE | Admit: 2016-08-17 | Discharge: 2016-08-17 | Disposition: A | Discharge status: Home / Self Care | Payer: 59 | Source: Ambulatory Visit | Attending: Emergency Medicine | Admitting: Emergency Medicine

## 2016-08-17 DIAGNOSIS — M79609 Pain in unspecified limb: Secondary | ICD-10-CM | POA: Diagnosis not present

## 2016-08-17 DIAGNOSIS — M79605 Pain in left leg: Secondary | ICD-10-CM | POA: Insufficient documentation

## 2016-08-17 NOTE — Progress Notes (Signed)
VASCULAR LAB PRELIMINARY  PRELIMINARY  PRELIMINARY  PRELIMINARY  Right lower extremity venous duplex completed.    Preliminary report:  There is no DVT or SVT noted in the right lower extremity.  Sevin Farone, RVT 08/17/2016, 9:12 AM

## 2018-01-17 DIAGNOSIS — J039 Acute tonsillitis, unspecified: Secondary | ICD-10-CM | POA: Insufficient documentation

## 2018-01-17 DIAGNOSIS — J029 Acute pharyngitis, unspecified: Secondary | ICD-10-CM | POA: Insufficient documentation

## 2019-01-19 ENCOUNTER — Ambulatory Visit (HOSPITAL_COMMUNITY)
Admission: EM | Admit: 2019-01-19 | Discharge: 2019-01-19 | Disposition: A | Discharge status: Home / Self Care | Payer: 59 | Attending: Family Medicine | Admitting: Family Medicine

## 2019-01-19 ENCOUNTER — Encounter (HOSPITAL_COMMUNITY): Payer: Self-pay

## 2019-01-19 DIAGNOSIS — R42 Dizziness and giddiness: Secondary | ICD-10-CM | POA: Diagnosis not present

## 2019-01-19 MED ORDER — MECLIZINE HCL 12.5 MG PO TABS: 12.5000 mg | ORAL_TABLET | Freq: Three times a day (TID) | ORAL | 0 refills | Status: DC | PRN

## 2019-01-19 NOTE — ED Provider Notes (Signed)
[facility]    CSN: [id number] Arrival date & time: 01/19/19  1707     History   Chief Complaint Chief Complaint  Patient presents with  . Dizziness    HPI Katherine Hood is a 43 y.o. female.   43 year old woman who comes in for an initial visit at most: Urgent care complaining of dizziness and nausea all day, states a few hours ago had fullness in both ears but has subsided.  She initially had an upper respiratory infection a couple weeks ago and was treated with amoxicillin.  In addition she was given a prescription for Flonase.  Patient woke up with dizziness this morning.  It is worse when she moves her head or closes her eyes.     Past Medical History:  Diagnosis Date  . Gestational diabetes   . Gestational diabetes mellitus 08/23/2015  . Gestational diabetes mellitus, antepartum   . Hx of varicella   . Hyperprolactinemia (HCC)   . Infertility, female   . Medical history non-contributory   . PCOS (polycystic ovarian syndrome)   . Postpartum care following cesarean delivery (9/24) 09/23/2015    Patient Active Problem List   Diagnosis Date Noted  . PIH (pregnancy induced hypertension) 09/24/2015  . Postpartum care following cesarean delivery (9/24) 09/23/2015  . GDM, class A2 09/22/2015    Past Surgical History:  Procedure Laterality Date  . CESAREAN SECTION N/A 09/23/2015   Procedure: CESAREAN SECTION;  Surgeon: Maxie Better, MD;  Location: [facility];  Service: Obstetrics;  Laterality: N/A;  . HERNIA REPAIR      OB History    Gravida  2   Para  1   Term  1   Preterm      AB  1   Living  0     SAB      TAB      Ectopic  1   Multiple  0   Live Births               Home Medications    Prior to Admission medications   Medication Sig Start Date End Date Taking? Authorizing Provider  meclizine (ANTIVERT) 12.5 MG tablet Take 1 tablet (12.5 mg total) by mouth 3 (three) times daily as needed for dizziness. 01/19/19    Elvina Sidle, MD    Family History Family History  Problem Relation Age of Onset  . Diabetes Mother   . Cancer Mother   . Heart disease Mother   . Diabetes Father   . Endometriosis Sister   . Hypertension Maternal Grandmother     Social History Social History   Tobacco Use  . Smoking status: Never Smoker  . Smokeless tobacco: Never Used  Substance Use Topics  . Alcohol use: No  . Drug use: No     Allergies   Patient has no known allergies.   Review of Systems Review of Systems  Constitutional: Negative.   HENT: Positive for congestion. Negative for ear discharge, facial swelling, hearing loss, rhinorrhea, sinus pressure, sinus pain, sneezing, sore throat and tinnitus.   Neurological: Positive for dizziness and headaches.     Physical Exam Triage Vital Signs ED Triage Vitals  Enc Vitals Group     BP 01/19/19 1741 (!) 168/81     Pulse Rate 01/19/19 1741 80     Resp 01/19/19 1741 18     Temp 01/19/19 1741 98.4 F (36.9 C)     Temp Source 01/19/19 1741 Oral  SpO2 01/19/19 1741 99 %     Weight --      Height --      Head Circumference --      Peak Flow --      Pain Score 01/19/19 1742 0     Pain Loc --      Pain Edu? --      Excl. in GC? --    No data found.  Updated Vital Signs BP (!) 168/81 (BP Location: Right Arm)   Pulse 80   Temp 98.4 F (36.9 C) (Oral)   Resp 18   LMP 12/22/2018   SpO2 99%    Physical Exam Vitals signs and nursing note reviewed.  Constitutional:      Appearance: Normal appearance.  HENT:     Head: Normocephalic.     Right Ear: Tympanic membrane, ear canal and external ear normal.     Left Ear: Tympanic membrane and ear canal normal.     Nose: Nose normal.     Mouth/Throat:     Mouth: Mucous membranes are moist.     Pharynx: Oropharynx is clear.  Eyes:     Conjunctiva/sclera: Conjunctivae normal.     Pupils: Pupils are equal, round, and reactive to light.  Neck:     Musculoskeletal: Normal range of motion  and neck supple.  Cardiovascular:     Rate and Rhythm: Normal rate and regular rhythm.     Heart sounds: Normal heart sounds.  Pulmonary:     Effort: Pulmonary effort is normal.     Breath sounds: Normal breath sounds.  Musculoskeletal: Normal range of motion.  Skin:    General: Skin is warm and dry.  Neurological:     General: No focal deficit present.     Mental Status: She is alert.  Psychiatric:        Mood and Affect: Mood normal.        Thought Content: Thought content normal.      UC Treatments / Results  Labs (all labs ordered are listed, but only abnormal results are displayed) Labs Reviewed - No data to display  EKG None  Radiology No results found.  Procedures Procedures (including critical care time)  Medications Ordered in UC Medications - No data to display  Initial Impression / Assessment and Plan / UC Course  I have reviewed the triage vital signs and the nursing notes.  Pertinent labs & imaging results that were available during my care of the patient were reviewed by me and considered in my medical decision making (see chart for details).    Final Clinical Impressions(s) / UC Diagnoses   Final diagnoses:  Vertigo   Discharge Instructions   None    ED Prescriptions    Medication Sig Dispense Auth. Provider   meclizine (ANTIVERT) 12.5 MG tablet Take 1 tablet (12.5 mg total) by mouth 3 (three) times daily as needed for dizziness. 30 tablet Elvina Sidle, MD     Controlled Substance Prescriptions [place] Controlled Substance Registry consulted? Not Applicable   Elvina Sidle, MD 01/19/19 [phone]

## 2019-01-19 NOTE — ED Triage Notes (Signed)
Pt c/o dizziness and nausea all day, states a few hours ago had fullness in both ears but has went away

## 2019-03-15 ENCOUNTER — Encounter (HOSPITAL_COMMUNITY): Payer: Self-pay

## 2019-03-15 ENCOUNTER — Ambulatory Visit (HOSPITAL_COMMUNITY)
Admission: EM | Admit: 2019-03-15 | Discharge: 2019-03-15 | Disposition: A | Discharge status: Home / Self Care | Payer: 59 | Attending: Family Medicine | Admitting: Family Medicine

## 2019-03-15 ENCOUNTER — Other Ambulatory Visit: Payer: Self-pay

## 2019-03-15 DIAGNOSIS — H9202 Otalgia, left ear: Secondary | ICD-10-CM | POA: Diagnosis not present

## 2019-03-15 DIAGNOSIS — J3489 Other specified disorders of nose and nasal sinuses: Secondary | ICD-10-CM

## 2019-03-15 MED ORDER — TRIAMCINOLONE ACETONIDE 55 MCG/ACT NA AERO: 2.0000 | INHALATION_SPRAY | Freq: Every day | NASAL | 12 refills | Status: DC

## 2019-03-15 MED ORDER — SALINE SPRAY 0.65 % NA SOLN: 1.0000 | NASAL | 0 refills | Status: DC | PRN

## 2019-03-15 NOTE — ED Triage Notes (Signed)
Pt presents with sore throat, sinus pressure & pain, and left ear pain since Friday.

## 2019-03-15 NOTE — Discharge Instructions (Signed)
Push fluids to ensure adequate hydration and keep secretions thin.  Tylenol and/or ibuprofen as needed for pain or fevers.  Daily Nasacort.  Nasal spray as needed to help with congestion.  If symptoms worsen or do not improve in the next week to return to be seen or to follow up with your PCP.

## 2019-03-15 NOTE — ED Provider Notes (Signed)
[facility]    CSN: [id number] Arrival date & time: 03/15/19  0809     History   Chief Complaint Chief Complaint  Patient presents with  . Sinus Pressure  . Sore Throat  . Otalgia    Left Side    HPI Katherine Hood is a 43 y.o. female.   Katherine Hood presents with complaints of left ear pressure, left tonsil pain and sinus pressure. Started three days ago. No fevers. No cough. Hasn't worsened but has not improved. No gi/gu complaints. No rash. No known ill contacts. Has been taking aleve, ibuprofen, claritin and xyzal which have helped. Has had similar in the past which ended up causing vertigo symptoms. Does not have a PCP and has never had to see ENT. Without contributing medical history.      ROS per HPI, negative if not otherwise mentioned.      Past Medical History:  Diagnosis Date  . Gestational diabetes   . Gestational diabetes mellitus 08/23/2015  . Gestational diabetes mellitus, antepartum   . Hx of varicella   . Hyperprolactinemia (HCC)   . Infertility, female   . Medical history non-contributory   . PCOS (polycystic ovarian syndrome)   . Postpartum care following cesarean delivery (9/24) 09/23/2015    Patient Active Problem List   Diagnosis Date Noted  . PIH (pregnancy induced hypertension) 09/24/2015  . Postpartum care following cesarean delivery (9/24) 09/23/2015  . GDM, class A2 09/22/2015    Past Surgical History:  Procedure Laterality Date  . CESAREAN SECTION N/A 09/23/2015   Procedure: CESAREAN SECTION;  Surgeon: Maxie Better, MD;  Location: [facility];  Service: Obstetrics;  Laterality: N/A;  . HERNIA REPAIR      OB History    Gravida  2   Para  1   Term  1   Preterm      AB  1   Living  0     SAB      TAB      Ectopic  1   Multiple  0   Live Births               Home Medications    Prior to Admission medications   Medication Sig Start Date End Date Taking? Authorizing Provider  meclizine (ANTIVERT)  12.5 MG tablet Take 1 tablet (12.5 mg total) by mouth 3 (three) times daily as needed for dizziness. 01/19/19   Elvina Sidle, MD  sodium chloride (OCEAN) 0.65 % SOLN nasal spray Place 1 spray into both nostrils as needed for congestion. 03/15/19   Georgetta Haber, NP  triamcinolone (NASACORT) 55 MCG/ACT AERO nasal inhaler Place 2 sprays into the nose daily. 03/15/19   Georgetta Haber, NP    Family History Family History  Problem Relation Age of Onset  . Diabetes Mother   . Cancer Mother   . Heart disease Mother   . Diabetes Father   . Endometriosis Sister   . Hypertension Maternal Grandmother     Social History Social History   Tobacco Use  . Smoking status: Never Smoker  . Smokeless tobacco: Never Used  Substance Use Topics  . Alcohol use: No  . Drug use: No     Allergies   Patient has no known allergies.   Review of Systems Review of Systems   Physical Exam Triage Vital Signs ED Triage Vitals  Enc Vitals Group     BP 03/15/19 0817 (!) 143/87     Pulse Rate 03/15/19  0817 68     Resp 03/15/19 0817 16     Temp 03/15/19 0817 98 F (36.7 C)     Temp Source 03/15/19 0817 Oral     SpO2 03/15/19 0817 100 %     Weight --      Height --      Head Circumference --      Peak Flow --      Pain Score 03/15/19 0829 7     Pain Loc --      Pain Edu? --      Excl. in GC? --    No data found.  Updated Vital Signs BP (!) 143/87 (BP Location: Right Arm)   Pulse 68   Temp 98 F (36.7 C) (Oral)   Resp 16   LMP 03/08/2019   SpO2 100%   Visual Acuity Right Eye Distance:   Left Eye Distance:   Bilateral Distance:    Right Eye Near:   Left Eye Near:    Bilateral Near:     Physical Exam Constitutional:      General: She is not in acute distress.    Appearance: She is well-developed.  HENT:     Head: Normocephalic and atraumatic.     Right Ear: Tympanic membrane, ear canal and external ear normal.     Left Ear: Tympanic membrane, ear canal and external ear  normal.     Nose:     Right Sinus: Maxillary sinus tenderness present. No frontal sinus tenderness.     Left Sinus: Maxillary sinus tenderness present. No frontal sinus tenderness.     Mouth/Throat:     Pharynx: Uvula midline.     Tonsils: No tonsillar exudate. Swelling: 1+ on the right. 1+ on the left.  Eyes:     Conjunctiva/sclera: Conjunctivae normal.     Pupils: Pupils are equal, round, and reactive to light.  Cardiovascular:     Rate and Rhythm: Normal rate and regular rhythm.     Heart sounds: Normal heart sounds.  Pulmonary:     Effort: Pulmonary effort is normal.     Breath sounds: Normal breath sounds.  Lymphadenopathy:     Cervical: No cervical adenopathy.  Skin:    General: Skin is warm and dry.  Neurological:     Mental Status: She is alert and oriented to person, place, and time.      UC Treatments / Results  Labs (all labs ordered are listed, but only abnormal results are displayed) Labs Reviewed - No data to display  EKG None  Radiology No results found.  Procedures Procedures (including critical care time)  Medications Ordered in UC Medications - No data to display  Initial Impression / Assessment and Plan / UC Course  I have reviewed the triage vital signs and the nursing notes.  Pertinent labs & imaging results that were available during my care of the patient were reviewed by me and considered in my medical decision making (see chart for details).     Non toxic. Benign physical exam.  History and physical consistent with viral illness.  Supportive cares recommended. If symptoms worsen or do not improve in the next week to return to be seen or to follow up with PCP.  Patient verbalized understanding and agreeable to plan.   Final Clinical Impressions(s) / UC Diagnoses   Final diagnoses:  Otalgia of left ear  Sinus pressure     Discharge Instructions     Push fluids to ensure adequate hydration  and keep secretions thin.  Tylenol and/or  ibuprofen as needed for pain or fevers.  Daily Nasacort.  Nasal spray as needed to help with congestion.  If symptoms worsen or do not improve in the next week to return to be seen or to follow up with your PCP.     ED Prescriptions    Medication Sig Dispense Auth. Provider   triamcinolone (NASACORT) 55 MCG/ACT AERO nasal inhaler Place 2 sprays into the nose daily. 1 Inhaler Linus Mako B, NP   sodium chloride (OCEAN) 0.65 % SOLN nasal spray Place 1 spray into both nostrils as needed for congestion. 60 mL Linus Mako B, NP     Controlled Substance Prescriptions [place] Controlled Substance Registry consulted? Not Applicable   Georgetta Haber, NP 03/15/19 [phone]

## 2019-03-16 ENCOUNTER — Other Ambulatory Visit: Payer: Self-pay

## 2019-03-16 ENCOUNTER — Encounter (HOSPITAL_COMMUNITY): Payer: Self-pay

## 2019-03-16 ENCOUNTER — Ambulatory Visit (HOSPITAL_COMMUNITY)
Admission: EM | Admit: 2019-03-16 | Discharge: 2019-03-16 | Disposition: A | Discharge status: Home / Self Care | Payer: 59 | Attending: Family Medicine | Admitting: Family Medicine

## 2019-03-16 DIAGNOSIS — J358 Other chronic diseases of tonsils and adenoids: Secondary | ICD-10-CM

## 2019-03-16 DIAGNOSIS — J039 Acute tonsillitis, unspecified: Secondary | ICD-10-CM

## 2019-03-16 LAB — POCT RAPID STREP A: Streptococcus, Group A Screen (Direct): NEGATIVE

## 2019-03-16 MED ORDER — AMOXICILLIN 875 MG PO TABS: 875.0000 mg | ORAL_TABLET | Freq: Two times a day (BID) | ORAL | 0 refills | Status: AC

## 2019-03-16 NOTE — ED Triage Notes (Signed)
Pt presents with sore throat and ear pain X 4 days.

## 2019-03-16 NOTE — Discharge Instructions (Signed)
Take Tylenol or ibuprofen for pain Take antibiotic 2 times a day for 10 days Take 2 doses today May use sore throat lozenges or spray May use salt water gargles I recommend you follow-up with an ENT for recurrent tonsillitis

## 2019-03-16 NOTE — ED Provider Notes (Signed)
[facility]    CSN: [id number] Arrival date & time: 03/16/19  0818     History   Chief Complaint Chief Complaint  Patient presents with  . Sore Throat  . Otalgia    HPI Katherine Hood is a 43 y.o. female.   HPI Patient presents with sore throat and Left ear pain since Friday. She states throat pain is worse on the left side. She was seen here yesterday and given nasocort which she used but now reports she feels that her symptoms have worsened. She denies fever, cough, chills or body aches. She states she has been taking ibuprofen for her pain and doing salt water gargles but has not gotten any relief from either.  She denies fever.  She denies exposure to strep. She states she is previously had difficulty with tonsillitis, always on one side (left)  Past Medical History:  Diagnosis Date  . Gestational diabetes   . Gestational diabetes mellitus 08/23/2015  . Gestational diabetes mellitus, antepartum   . Hx of varicella   . Hyperprolactinemia (HCC)   . Infertility, female   . Medical history non-contributory   . PCOS (polycystic ovarian syndrome)   . Postpartum care following cesarean delivery (9/24) 09/23/2015    Patient Active Problem List   Diagnosis Date Noted  . PIH (pregnancy induced hypertension) 09/24/2015  . Postpartum care following cesarean delivery (9/24) 09/23/2015  . GDM, class A2 09/22/2015    Past Surgical History:  Procedure Laterality Date  . CESAREAN SECTION N/A 09/23/2015   Procedure: CESAREAN SECTION;  Surgeon: Maxie Better, MD;  Location: [facility];  Service: Obstetrics;  Laterality: N/A;  . HERNIA REPAIR      OB History    Gravida  2   Para  1   Term  1   Preterm      AB  1   Living  0     SAB      TAB      Ectopic  1   Multiple  0   Live Births               Home Medications    Prior to Admission medications   Medication Sig Start Date End Date Taking? Authorizing Provider  amoxicillin (AMOXIL)  875 MG tablet Take 1 tablet (875 mg total) by mouth 2 (two) times daily for 10 days. 03/16/19 03/26/19  Eustace Moore, MD  meclizine (ANTIVERT) 12.5 MG tablet Take 1 tablet (12.5 mg total) by mouth 3 (three) times daily as needed for dizziness. 01/19/19   Elvina Sidle, MD  sodium chloride (OCEAN) 0.65 % SOLN nasal spray Place 1 spray into both nostrils as needed for congestion. 03/15/19   Georgetta Haber, NP  triamcinolone (NASACORT) 55 MCG/ACT AERO nasal inhaler Place 2 sprays into the nose daily. 03/15/19   Georgetta Haber, NP    Family History Family History  Problem Relation Age of Onset  . Diabetes Mother   . Cancer Mother   . Heart disease Mother   . Diabetes Father   . Endometriosis Sister   . Hypertension Maternal Grandmother     Social History Social History   Tobacco Use  . Smoking status: Never Smoker  . Smokeless tobacco: Never Used  Substance Use Topics  . Alcohol use: No  . Drug use: No     Allergies   Patient has no known allergies.   Review of Systems Review of Systems  Constitutional: Negative for chills and  fever.  HENT: Positive for ear pain and sore throat.   Eyes: Negative for pain and visual disturbance.  Respiratory: Negative for cough and shortness of breath.   Cardiovascular: Negative for chest pain and palpitations.  Gastrointestinal: Negative for abdominal pain and vomiting.  Genitourinary: Negative for dysuria and hematuria.  Musculoskeletal: Negative for arthralgias and back pain.  Skin: Negative for color change and rash.  Neurological: Negative for seizures and syncope.  Psychiatric/Behavioral: Negative.   All other systems reviewed and are negative.    Physical Exam Triage Vital Signs ED Triage Vitals  Enc Vitals Group     BP 03/16/19 0848 (!) 142/92     Pulse Rate 03/16/19 0848 74     Resp 03/16/19 0848 18     Temp 03/16/19 0848 98.4 F (36.9 C)     Temp Source 03/16/19 0848 Oral     SpO2 03/16/19 0848 99 %     Weight  --      Height --      Head Circumference --      Peak Flow --      Pain Score 03/16/19 0849 8     Pain Loc --      Pain Edu? --      Excl. in GC? --    No data found.  Updated Vital Signs BP (!) 142/92 (BP Location: Right Arm)   Pulse 74   Temp 98.4 F (36.9 C) (Oral)   Resp 18   LMP 03/08/2019   SpO2 99%      Physical Exam Constitutional:      General: She is not in acute distress.    Appearance: She is well-developed.  HENT:     Head: Normocephalic and atraumatic.     Right Ear: Tympanic membrane and ear canal normal.     Left Ear: Tympanic membrane and ear canal normal.     Mouth/Throat:     Mouth: Mucous membranes are moist.     Pharynx: Pharyngeal swelling and posterior oropharyngeal erythema present.     Tonsils: No tonsillar exudate. Swelling: 4+ on the left.     Comments: Left tonsil is swollen, touching the uvula at the midline.  Right tonsil is mildly enlarged.  Erythema.  No exudate Eyes:     Conjunctiva/sclera: Conjunctivae normal.     Pupils: Pupils are equal, round, and reactive to light.  Neck:     Musculoskeletal: Normal range of motion.  Cardiovascular:     Rate and Rhythm: Normal rate and regular rhythm.     Heart sounds: Normal heart sounds.  Pulmonary:     Effort: Pulmonary effort is normal. No respiratory distress.     Breath sounds: Normal breath sounds.  Abdominal:     General: There is no distension.     Palpations: Abdomen is soft.  Musculoskeletal: Normal range of motion.  Lymphadenopathy:     Cervical: Cervical adenopathy present.  Skin:    General: Skin is warm and dry.  Neurological:     General: No focal deficit present.     Mental Status: She is alert.      UC Treatments / Results  Labs (all labs ordered are listed, but only abnormal results are displayed) Labs Reviewed  POCT RAPID STREP A    EKG None  Radiology No results found.  Procedures Procedures (including critical care time)  Medications Ordered in UC  Medications - No data to display  Initial Impression / Assessment and Plan / UC Course  I have reviewed the triage vital signs and the nursing notes.  Pertinent labs & imaging results that were available during my care of the patient were reviewed by me and considered in my medical decision making (see chart for details).    I discussed with the patient that recurring tonsillitis that is asymmetric should probably be evaluated by ear nose and throat specialist.  Final Clinical Impressions(s) / UC Diagnoses   Final diagnoses:  Tonsil asymmetry  Acute tonsillitis, unspecified etiology     Discharge Instructions     Take Tylenol or ibuprofen for pain Take antibiotic 2 times a day for 10 days Take 2 doses today May use sore throat lozenges or spray May use salt water gargles I recommend you follow-up with an ENT for recurrent tonsillitis    ED Prescriptions    Medication Sig Dispense Auth. Provider   amoxicillin (AMOXIL) 875 MG tablet Take 1 tablet (875 mg total) by mouth 2 (two) times daily for 10 days. 20 tablet Eustace Moore, MD     Controlled Substance Prescriptions [place] Controlled Substance Registry consulted? Not Applicable   Eustace Moore, MD 03/16/19 1027

## 2019-03-26 ENCOUNTER — Encounter (HOSPITAL_COMMUNITY): Payer: Self-pay | Admitting: Emergency Medicine

## 2019-03-26 ENCOUNTER — Other Ambulatory Visit: Payer: Self-pay

## 2019-03-26 ENCOUNTER — Ambulatory Visit (HOSPITAL_COMMUNITY)
Admission: EM | Admit: 2019-03-26 | Discharge: 2019-03-26 | Disposition: A | Discharge status: Home / Self Care | Payer: 59

## 2019-03-26 DIAGNOSIS — R52 Pain, unspecified: Secondary | ICD-10-CM | POA: Diagnosis not present

## 2019-03-26 DIAGNOSIS — Z20828 Contact with and (suspected) exposure to other viral communicable diseases: Secondary | ICD-10-CM

## 2019-03-26 NOTE — Discharge Instructions (Signed)
Rest.  Tylenol and/or ibuprofen as needed for pain or fevers.  Push fluids to ensure adequate hydration and keep secretions thin.  Over the counter treatments as needed.  If symptoms worsen or do not improve in the next week to return to be seen or to follow up with your PCP.

## 2019-03-26 NOTE — ED Provider Notes (Signed)
[facility]    CSN: [id number] Arrival date & time: 03/26/19  1738     History   Chief Complaint Chief Complaint  Patient presents with  . Generalized Body Aches  . Fatigue    HPI Katherine Hood is a 43 y.o. female.   Katherine Hood presents with complaints of fatigue and generalized body aches which started three days ago. No cough or sneezing. No ear pain, no runny nose. No sore throat. Her son had similar complaints and developed congested, tested positive for influenza today. No rash. No fever. No urinary complaints. No gi/gu complaints. Took theraflu which did help, last at 11a. Recently completed antibiotics for tonsillitis. No other known ill contacts. No recent travel. No asthma, doesn't smoke.    ROS per HPI, negative if not otherwise mentioned.      Past Medical History:  Diagnosis Date  . Gestational diabetes   . Gestational diabetes mellitus 08/23/2015  . Gestational diabetes mellitus, antepartum   . Hx of varicella   . Hyperprolactinemia (HCC)   . Infertility, female   . Medical history non-contributory   . PCOS (polycystic ovarian syndrome)   . Postpartum care following cesarean delivery (9/24) 09/23/2015    Patient Active Problem List   Diagnosis Date Noted  . PIH (pregnancy induced hypertension) 09/24/2015  . Postpartum care following cesarean delivery (9/24) 09/23/2015  . GDM, class A2 09/22/2015    Past Surgical History:  Procedure Laterality Date  . CESAREAN SECTION N/A 09/23/2015   Procedure: CESAREAN SECTION;  Surgeon: Maxie Better, MD;  Location: [facility];  Service: Obstetrics;  Laterality: N/A;  . HERNIA REPAIR      OB History    Gravida  2   Para  1   Term  1   Preterm      AB  1   Living  0     SAB      TAB      Ectopic  1   Multiple  0   Live Births               Home Medications    Prior to Admission medications   Medication Sig Start Date End Date Taking? Authorizing Provider  amoxicillin  (AMOXIL) 875 MG tablet Take 1 tablet (875 mg total) by mouth 2 (two) times daily for 10 days. Patient not taking: Reported on 03/26/2019 03/16/19 03/26/19  Eustace Moore, MD  meclizine (ANTIVERT) 12.5 MG tablet Take 1 tablet (12.5 mg total) by mouth 3 (three) times daily as needed for dizziness. Patient not taking: Reported on 03/26/2019 01/19/19   Elvina Sidle, MD  sodium chloride (OCEAN) 0.65 % SOLN nasal spray Place 1 spray into both nostrils as needed for congestion. Patient not taking: Reported on 03/26/2019 03/15/19   Linus Mako B, NP  triamcinolone (NASACORT) 55 MCG/ACT AERO nasal inhaler Place 2 sprays into the nose daily. Patient not taking: Reported on 03/26/2019 03/15/19   Georgetta Haber, NP    Family History Family History  Problem Relation Age of Onset  . Diabetes Mother   . Cancer Mother   . Heart disease Mother   . Diabetes Father   . Endometriosis Sister   . Hypertension Maternal Grandmother     Social History Social History   Tobacco Use  . Smoking status: Never Smoker  . Smokeless tobacco: Never Used  Substance Use Topics  . Alcohol use: No  . Drug use: No     Allergies   Patient  has no known allergies.   Review of Systems Review of Systems   Physical Exam Triage Vital Signs ED Triage Vitals  Enc Vitals Group     BP 03/26/19 1753 (!) 148/90     Pulse Rate 03/26/19 1752 98     Resp 03/26/19 1752 18     Temp 03/26/19 1752 97.9 F (36.6 C)     Temp src --      SpO2 03/26/19 1752 100 %     Weight --      Height --      Head Circumference --      Peak Flow --      Pain Score 03/26/19 1753 5     Pain Loc --      Pain Edu? --      Excl. in GC? --    No data found.  Updated Vital Signs BP (!) 148/90   Pulse 98   Temp 97.9 F (36.6 C)   Resp 18   LMP 03/08/2019   SpO2 100%   Physical Exam Constitutional:      General: She is not in acute distress.    Appearance: She is well-developed.  HENT:     Head: Normocephalic and  atraumatic.     Right Ear: Tympanic membrane, ear canal and external ear normal.     Left Ear: Tympanic membrane, ear canal and external ear normal.     Nose: Nose normal.     Mouth/Throat:     Pharynx: Uvula midline.     Tonsils: No tonsillar exudate.  Eyes:     Conjunctiva/sclera: Conjunctivae normal.     Pupils: Pupils are equal, round, and reactive to light.  Cardiovascular:     Rate and Rhythm: Normal rate and regular rhythm.     Heart sounds: Normal heart sounds.  Pulmonary:     Effort: Pulmonary effort is normal.     Breath sounds: Normal breath sounds.  Skin:    General: Skin is warm and dry.  Neurological:     Mental Status: She is alert and oriented to person, place, and time.      UC Treatments / Results  Labs (all labs ordered are listed, but only abnormal results are displayed) Labs Reviewed - No data to display  EKG None  Radiology No results found.  Procedures Procedures (including critical care time)  Medications Ordered in UC Medications - No data to display  Initial Impression / Assessment and Plan / UC Course  I have reviewed the triage vital signs and the nursing notes.  Pertinent labs & imaging results that were available during my care of the patient were reviewed by me and considered in my medical decision making (see chart for details).     Non toxic. Benign physical exam.  Son with influenza. No risk factors to indicate tamiflu need. Supportive cares recommended. Return precautions provided. Patient verbalized understanding and agreeable to plan.    Final Clinical Impressions(s) / UC Diagnoses   Final diagnoses:  Body aches  Exposure to the flu     Discharge Instructions     Rest.  Tylenol and/or ibuprofen as needed for pain or fevers.  Push fluids to ensure adequate hydration and keep secretions thin.  Over the counter treatments as needed.  If symptoms worsen or do not improve in the next week to return to be seen or to follow  up with your PCP.     ED Prescriptions    None  Controlled Substance Prescriptions [place] Controlled Substance Registry consulted? Not Applicable   Georgetta Haber, NP 03/26/19 1843

## 2019-03-26 NOTE — ED Triage Notes (Signed)
Pt c/o body aches and fatigue since Tuesday.

## 2019-06-15 ENCOUNTER — Other Ambulatory Visit (HOSPITAL_COMMUNITY): Payer: 59 | Attending: Psychiatry | Admitting: Family

## 2019-06-15 ENCOUNTER — Other Ambulatory Visit: Payer: Self-pay

## 2019-06-15 DIAGNOSIS — F331 Major depressive disorder, recurrent, moderate: Secondary | ICD-10-CM | POA: Insufficient documentation

## 2019-06-15 DIAGNOSIS — F320 Major depressive disorder, single episode, mild: Secondary | ICD-10-CM

## 2019-06-15 DIAGNOSIS — G479 Sleep disorder, unspecified: Secondary | ICD-10-CM | POA: Insufficient documentation

## 2019-06-15 DIAGNOSIS — R7303 Prediabetes: Secondary | ICD-10-CM | POA: Insufficient documentation

## 2019-06-15 DIAGNOSIS — Z79899 Other long term (current) drug therapy: Secondary | ICD-10-CM | POA: Insufficient documentation

## 2019-06-15 DIAGNOSIS — Z818 Family history of other mental and behavioral disorders: Secondary | ICD-10-CM | POA: Insufficient documentation

## 2019-06-15 DIAGNOSIS — F419 Anxiety disorder, unspecified: Secondary | ICD-10-CM | POA: Insufficient documentation

## 2019-06-15 NOTE — Progress Notes (Signed)
Virtual Visit via Video Note  I connected with Katherine Hood on 06/15/19 at  9:00 AM EDT by a video enabled telemedicine application and verified that I am speaking with the correct person using two identifiers.  I discussed the limitations of evaluation and management by telemedicine and the availability of in person appointments. The patient expressed understanding and agreed to proceed. I discussed the assessment and treatment plan with the patient. The patient was provided an opportunity to ask questions and all were answered. The patient agreed with the plan and demonstrated an understanding of the instructions.  The patient was advised to call back or seek an in-person evaluation if the symptoms worsen or if the condition fails to improve as anticipated.  I provided 40 minutes of non-face-to-face time during this encounter.   Carlis Abbott, RITA, M.Ed, CNA       Comprehensive Clinical Assessment (CCA) Note  06/15/2019 Katherine Hood [id number]  Visit Diagnosis:   No diagnosis found.    CCA Part One  Part One has been completed on paper by the patient.  (See scanned document in Chart Review)  CCA Part Two A  Intake/Chief Complaint:  CCA Intake With Chief Complaint CCA Part Two Date: 06/15/19 CCA Part Two Time: 4098 Chief Complaint/Presenting Problem: This is a 43 yr old, married, employed, [place] American female, who was referred per previous pt in [place]; treatment for worsening anxiety symptoms and depression.  Reports having panic attacks since March 2020; when she started working from home d/t COVID-19.  Most recent panic attack was this past Sunday.  Pt denies SI/HI or A/V hallucinations.  Stressors:  1) Job ([employer]) of 19 yrs.  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  "I haven't met quota since September 2019."  2)  Unresolved grief/loss issues:  Pt reports having an ectopic pregnancy in 2014.  States she and husband had been  trying to conceive for two yrs; until they had a baby three yrs ago.  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  Pt has PCOS.  Pt denies any previous psychiatric admissions; saw a therapist in 2014.  Denies any hx of self-injurious behaviors.  Family hx:  Mother (Depression). Patients Currently Reported Symptoms/Problems: Panic attacks, tearfulness, isolation, sadness, poor sleep, poor concentration, ruminating thoughts, irritable Collateral Involvement: Reports husband is very supportive; along with best friends. Individual's Strengths: "I am very loyal." Individual's Abilities: States she can dance. Type of Services Patient Feels Are Needed: MH-IOP  Mental Health Symptoms Depression:  Depression: Change in energy/activity, Sleep (too much or little), Difficulty Concentrating, Irritability, Tearfulness  Mania:  Mania: N/A  Anxiety:   Anxiety: Restlessness, Worrying  Psychosis:  Psychosis: N/A  Trauma:  Trauma: N/A  Obsessions:  Obsessions: N/A  Compulsions:  Compulsions: N/A  Inattention:  Inattention: N/A  Hyperactivity/Impulsivity:  Hyperactivity/Impulsivity: N/A  Oppositional/Defiant Behaviors:  Oppositional/Defiant Behaviors: N/A  Borderline Personality:  Emotional Irregularity: N/A  Other Mood/Personality Symptoms:      Mental Status Exam Appearance and self-care  Stature:  Stature: Average  Weight:     Clothing:  Clothing: Casual  Grooming:  Grooming: Normal  Cosmetic use:  Cosmetic Use: None  Posture/gait:  Posture/Gait: Normal  Motor activity:     Sensorium  Attention:  Attention: Normal  Concentration:  Concentration: Normal  Orientation:  Orientation: X5  Recall/memory:  Recall/Memory: Normal  Affect and Mood  Affect:  Affect: Labile  Mood:  Mood: Depressed  Relating  Eye contact:  Eye Contact:  Normal  Facial expression:  Facial Expression: Sad  Attitude toward examiner:  Attitude Toward Examiner: Cooperative  Thought and Language  Speech flow: Speech Flow: Normal  Thought  content:  Thought Content: Appropriate to mood and circumstances  Preoccupation:     Hallucinations:     Organization:     Transport planner of Knowledge:  Fund of Knowledge: Average  Intelligence:  Intelligence: Average  Abstraction:  Abstraction: Normal  Judgement:  Judgement: Normal  Reality Testing:  Reality Testing: Adequate  Insight:  Insight: Good  Decision Making:  Decision Making: Normal  Social Functioning  Social Maturity:  Social Maturity: Isolates  Social Judgement:  Social Judgement: Normal  Stress  Stressors:  Stressors: Brewing technologist, Work  Coping Ability:  Coping Ability: English as a second language teacher Deficits:     Supports:      Family and Psychosocial History: Family history Marital status: Married Number of Years Married: 6 What types of issues is patient dealing with in the relationship?: No issues; states he's been very supportive. What is your sexual orientation?: heterosexual Does patient have children?: Yes How many children?: 1 How is patient's relationship with their children?: Pt has a 45 yr old son  Childhood History:  Childhood History By whom was/is the patient raised?: Both parents Additional childhood history information: Pt was born in [place], [place]; stayed there until age 53; when father's job relocated the family to [place].  According to pt, her parents had she and her siblings whenever they were very young.  Pt states when she was age 73, her mother was dx'd with cancer.  Then around age 70 her m-uncle (who was like a big brother) died of HIV; then P-GM died along with P-Aunt died all within that same year.  Pt states that between all those loses and her mother being ill; it was a very difficult time for her.  According to pt, school was "ok".  Reports at age 27 having problems with authority.  Denies any abuse or trauma. Patient's description of current relationship with people who raised him/her: Close to parents. Does patient have siblings?:  Yes Number of Siblings: 2 Description of patient's current relationship with siblings: Identical twin sister is currently going thru a divorce.  She has two kids.  Pt also has a younger sister. Did patient suffer any verbal/emotional/physical/sexual abuse as a child?: No Did patient suffer from severe childhood neglect?: No Has patient ever been sexually abused/assaulted/raped as an adolescent or adult?: No Was the patient ever a victim of a crime or a disaster?: No Witnessed domestic violence?: No Has patient been effected by domestic violence as an adult?: No  CCA Part Two B  Employment/Work Situation: Employment / Work Copywriter, advertising Employment situation: Employed Where is patient currently employed?: [employer] How long has patient been employed?: 56 yrs Patient's job has been impacted by current illness: Yes Describe how patient's job has been impacted: Not able to meet quotas or function Did You Receive Any Psychiatric Treatment/Services While in the [employer]?: No Are There Guns or Other Weapons in [place]?: No  Education: Education Did Teacher, adult education From [employer]?: Yes Did Physicist, medical?: Yes What Type of College Degree Do you Have?: Only has one semester left to finish at [employer] Did [place]?: No Did You Have An Individualized Education Program (IIEP): No Did You Have Any Difficulty At School?: No  Religion: Religion/Spirituality Are You A Religious Person?: Yes What is Your Religious Affiliation?: Non-Denominational  Leisure/Recreation: Leisure /  Recreation Leisure and Hobbies: Dance and work out  Exercise/Diet: Exercise/Diet Do You Exercise?: Yes What Type of Exercise Do You Do?: Dance, Other (Comment) How Many Times a Week Do You Exercise?: 1-3 times a week Have You Gained or Lost A Significant Amount of Weight in the Past Six Months?: No Do You Follow a Special Diet?: No Do You Have Any Trouble Sleeping?: Yes Explanation of Sleeping  Difficulties: Difficulty getting to sleep  CCA Part Two C  Alcohol/Drug Use: Alcohol / Drug Use Pain Medications: cc: MAR Prescriptions: cc:  MAR Over the Counter: cc:  MAR History of alcohol / drug use?: No history of alcohol / drug abuse                      CCA Part Three  ASAM's:  Six Dimensions of Multidimensional Assessment  Dimension 1:  Acute Intoxication and/or Withdrawal Potential:     Dimension 2:  Biomedical Conditions and Complications:     Dimension 3:  Emotional, Behavioral, or Cognitive Conditions and Complications:     Dimension 4:  Readiness to Change:     Dimension 5:  Relapse, Continued use, or Continued Problem Potential:     Dimension 6:  Recovery/Living Environment:      Substance use Disorder (SUD)    Social Function:  Social Functioning Social Maturity: Isolates Social Judgement: Normal  Stress:  Stress Stressors: Grief/losses, Work Coping Ability: Overwhelmed Patient Takes Medications The Way The Doctor Instructed?: Yes Priority Risk: Moderate Risk  Risk Assessment- Self-Harm Potential: Risk Assessment For Self-Harm Potential Thoughts of Self-Harm: No current thoughts Method: No plan Availability of Means: No access/NA  Risk Assessment -Dangerous to Others Potential: Risk Assessment For Dangerous to Others Potential Method: No Plan Availability of Means: No access or NA Intent: Vague intent or NA Notification Required: No need or identified person  DSM5 Diagnoses: Patient Active Problem List   Diagnosis Date Noted  . PIH (pregnancy induced hypertension) 09/24/2015  . Postpartum care following cesarean delivery (9/24) 09/23/2015  . GDM, class A2 09/22/2015    Patient Centered Plan: Patient is on the following Treatment Plan(s):  Anxiety and Depression  Recommendations for Services/Supports/Treatments: Recommendations for Services/Supports/Treatments Recommendations For Services/Supports/Treatments: IOP (Intensive  Outpatient Program)  Treatment Plan Summary:  Oriented pt to virtual MH-IOP.  Answered all questions.  Pt gave verbal consent for treatment, to complete any forms needed.  Pt also gave consent for attending group virtually d/t COVID-19 social distancing restrictions.  Encouraged online support groups thru [facility].  Will refer pt to a psychiatrist and a therapist.  R:  Pt receptive.  Referrals to Alternative Service(s): Referred to Alternative Service(s):   Place:   Date:   Time:    Referred to Alternative Service(s):   Place:   Date:   Time:    Referred to Alternative Service(s):   Place:   Date:   Time:    Referred to Alternative Service(s):   Place:   Date:   Time:     Shanine Kreiger, RITA, M.Ed, CNA

## 2019-06-15 NOTE — Progress Notes (Signed)
Virtual Visit via Video Note  I connected with Katherine Hood on 06/15/19 at 0800 by a video enabled telemedicine application and verified that I am speaking with the correct person using two identifiers.  I discussed the limitations of evaluation and management by telemedicine and the availability of in person appointments. The patient expressed understanding and agreed to proceed.  I discussed the assessment and treatment plan with the patient. The patient was provided an opportunity to ask questions and all were answered. The patient agreed with the plan and demonstrated an understanding of the instructions.  The patient was advised to call back or seek an in-person evaluation if the symptoms worsen or if the condition fails to improve as anticipated. I provided 40 minutes of non-face-to-face time during this encounter.                           Carlis Abbott, RITA

## 2019-06-16 ENCOUNTER — Encounter (HOSPITAL_COMMUNITY): Payer: Self-pay | Admitting: Psychiatry

## 2019-06-16 ENCOUNTER — Other Ambulatory Visit (HOSPITAL_COMMUNITY): Payer: 59 | Admitting: Psychiatry

## 2019-06-16 ENCOUNTER — Encounter (HOSPITAL_COMMUNITY): Payer: Self-pay | Admitting: Family

## 2019-06-16 ENCOUNTER — Other Ambulatory Visit: Payer: Self-pay

## 2019-06-16 DIAGNOSIS — F419 Anxiety disorder, unspecified: Secondary | ICD-10-CM | POA: Diagnosis not present

## 2019-06-16 DIAGNOSIS — F320 Major depressive disorder, single episode, mild: Secondary | ICD-10-CM

## 2019-06-16 DIAGNOSIS — F331 Major depressive disorder, recurrent, moderate: Secondary | ICD-10-CM | POA: Diagnosis present

## 2019-06-16 DIAGNOSIS — R7303 Prediabetes: Secondary | ICD-10-CM | POA: Diagnosis not present

## 2019-06-16 DIAGNOSIS — G479 Sleep disorder, unspecified: Secondary | ICD-10-CM | POA: Diagnosis not present

## 2019-06-16 DIAGNOSIS — Z79899 Other long term (current) drug therapy: Secondary | ICD-10-CM | POA: Diagnosis not present

## 2019-06-16 DIAGNOSIS — Z818 Family history of other mental and behavioral disorders: Secondary | ICD-10-CM | POA: Diagnosis not present

## 2019-06-16 MED ORDER — HYDROXYZINE PAMOATE 25 MG PO CAPS: 25.0000 mg | ORAL_CAPSULE | Freq: Two times a day (BID) | ORAL | 0 refills | Status: DC | PRN

## 2019-06-16 MED ORDER — SERTRALINE HCL 25 MG PO TABS: ORAL_TABLET | ORAL | 0 refills | Status: DC

## 2019-06-16 NOTE — Progress Notes (Signed)
Virtual Visit via Video Note  I connected with Katherine Hood on 06/16/19 at  9:00 AM EDT by a video enabled telemedicine application and verified that I am speaking with the correct person using two identifiers.  Location: Patient: Katherine Hood Provider: Lise Auer, LCSW  I discussed the limitations of evaluation and management by telemedicine and the availability of in person appointments. The patient expressed understanding and agreed to proceed.  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  Observations/Objective: Case Manager checked in with all group members to share discharge dates, follow up with questions and concerns and discuss paperwork needs. Counselor introduced Katherine Hood to the group members and engaged all participants in a "ice breaker" activity to promote comfort within the group. Katherine Hood shared a little about herself and what she is hoping to get out of the group. Counselor provided psychoeducation on the benefits of practicing guided imagery. Counselor shared a visual guided imagery and later had the group choose a specific guided imagery that was read aloud as they participated in the prompts. Katherine Hood shared that it was nice to quiet her brain and that she felt reduced stress. She noted that completing her laundry will help her to reduce stress as well. Counselor introduced the guest speaker, Frederich Balding from the [employer], who shared about Self-Care and Overall Wellness. All group members participated in the discussion and identified ways they could applying the ideas and activities to their daily lives. Katherine Hood shared her preferences in adding movement to her life and shared other takeaways from the presentation that she wants to apply.   Assessment and Plan: Counselor recommend that Katherine Hood continue in IOP treatment to address treatment plan goals and better manage mental health symptoms. Counselor advised to continue  medication management as prescribed, follow medical professionals orders and follow crisis/safety plan.   Follow Up Instructions: Counselor will send the Webex link for tomorrow's session.   The patient was advised to call back or seek an in-person evaluation if the symptoms worsen or if the condition fails to improve as anticipated.  I provided 180 minutes of non-face-to-face time during this encounter.   Lise Auer, LCSW

## 2019-06-16 NOTE — Progress Notes (Signed)
Virtual Visit via Telephone Note  I connected with Katherine Hood on 06/16/19 at  9:00 AM EDT by telephone and verified that I am speaking with the correct person using two identifiers.   I discussed the limitations, risks, security and privacy concerns of performing an evaluation and management service by telephone and the availability of in person appointments. I also discussed with the patient that there may be a patient responsible charge related to this service. The patient expressed understanding and agreed to proceed.  I discussed the assessment and treatment plan with the patient. The patient was provided an opportunity to ask questions and all were answered. The patient agreed with the plan and demonstrated an understanding of the instructions.   The patient was advised to call back or seek an in-person evaluation if the symptoms worsen or if the condition fails to improve as anticipated.  I provided 30  minutes of non-face-to-face time during this encounter.   Oneta Rackanika N Lakeasha Petion, NP    Psychiatric Initial Adult Assessment   Patient Identification: Donnie Ahoamika R Kohl MRN:  [id number] Date of Evaluation:  06/16/2019 Referral Source: Friend Chief Complaint:   Worsening depression and anxiety Visit Diagnosis: No diagnosis found.  History of Present Illness: Christia Kitko 43 year old African-American female presents after referral from previous patient. She reports she is currently employed by [employer] which is causing most of her depression symptoms.  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  Reports she had plans to follow-up with [employer] at work however will follow-up later this week.  She reports more recently due to COVID causing symptoms of worry and social anxiety as intensified.  States that she is currently working from home however has fears a going out in public.  Reports difficulty concentrating, low energy and  wanting to  rest most of the day.  Tiasha stated increased anxiety.  Patriciann reported a history of postpartum depression however did not follow-up with medications and or thearpist.  Reports her son is 42 years old.  Reports her husband of 6 years  has been supportive.  Denies history of physical or sexual abuse in the past.  Patient reports grief and loss of multiple family members.    Reported family history of depression (Mother) reports a twin sister.  Reports her twin sister struggles with anxiety and depression related to a recent divorce.  Alcario Droughtanika denied previous inpatient admissions.  Denied history of seizures or  Headaches.  Reports history of PCOS.  "Borderline diabetic" patient to attend intensive outpatient programming( IOP) on 06/16/2019  Associated Signs/Symptoms: Depression Symptoms:  depressed mood, difficulty concentrating, anxiety, disturbed sleep, (Hypo) Manic Symptoms:  Distractibility, Irritable Mood, Anxiety Symptoms:  Excessive Worry, Psychotic Symptoms:  Hallucinations: None PTSD Symptoms: NA  Past Psychiatric History: Postpartum depression x3 years ago.  Previous Psychotropic Medications: No   Substance Abuse History in the last 12 months:  No.  Consequences of Substance Abuse: NA  Past Medical History:  Past Medical History:  Diagnosis Date  . Gestational diabetes   . Gestational diabetes mellitus 08/23/2015  . Gestational diabetes mellitus, antepartum   . Hx of varicella   . Hyperprolactinemia (HCC)   . Infertility, female   . Medical history non-contributory   . PCOS (polycystic ovarian syndrome)   . Postpartum care following cesarean delivery (9/24) 09/23/2015    Past Surgical History:  Procedure Laterality Date  . CESAREAN SECTION N/A 09/23/2015   Procedure: CESAREAN SECTION;  Surgeon: Maxie BetterSheronette Cousins, MD;  Location: [facility];  Service: Obstetrics;  Laterality: N/A;  . HERNIA REPAIR      Family Psychiatric History: Mother: Depression, twin sister 60y.o:  Depression and anxiety  Family History:  Family History  Problem Relation Age of Onset  . Diabetes Mother   . Cancer Mother   . Heart disease Mother   . Diabetes Father   . Endometriosis Sister   . Hypertension Maternal Grandmother     Social History:   Social History   Socioeconomic History  . Marital status: Married    Spouse name: Not on file  . Number of children: Not on file  . Years of education: Not on file  . Highest education level: Not on file  Occupational History  . Not on file  Social Needs  . Financial resource strain: Not on file  . Food insecurity    Worry: Not on file    Inability: Not on file  . Transportation needs    Medical: Not on file    Non-medical: Not on file  Tobacco Use  . Smoking status: Never Smoker  . Smokeless tobacco: Never Used  Substance and Sexual Activity  . Alcohol use: No  . Drug use: No  . Sexual activity: Yes  Lifestyle  . Physical activity    Days per week: Not on file    Minutes per session: Not on file  . Stress: Not on file  Relationships  . Social Herbalist on phone: Not on file    Gets together: Not on file    Attends religious service: Not on file    Active member of club or organization: Not on file    Attends meetings of clubs or organizations: Not on file    Relationship status: Not on file  Other Topics Concern  . Not on file  Social History Narrative  . Not on file    Additional Social History:   Allergies:  No Known Allergies  Metabolic Disorder Labs: No results found for: HGBA1C, MPG No results found for: PROLACTIN No results found for: CHOL, TRIG, HDL, CHOLHDL, VLDL, LDLCALC No results found for: TSH  Therapeutic Level Labs: No results found for: LITHIUM No results found for: CBMZ No results found for: VALPROATE  Current Medications: Current Outpatient Medications  Medication Sig Dispense Refill  . meclizine (ANTIVERT) 12.5 MG tablet Take 1 tablet (12.5 mg total) by mouth 3  (three) times daily as needed for dizziness. (Patient not taking: Reported on 03/26/2019) 30 tablet 0  . sodium chloride (OCEAN) 0.65 % SOLN nasal spray Place 1 spray into both nostrils as needed for congestion. (Patient not taking: Reported on 03/26/2019) 60 mL 0  . triamcinolone (NASACORT) 55 MCG/ACT AERO nasal inhaler Place 2 sprays into the nose daily. (Patient not taking: Reported on 03/26/2019) 1 Inhaler 12   No current facility-administered medications for this visit.     Musculoskeletal: Telephone assessment  Psychiatric Specialty Exam: ROS  unknown if currently breastfeeding.There is no height or weight on file to calculate BMI.  General Appearance: NA  Eye Contact:  NA  Speech:  Clear and Coherent  Volume:  Normal  Mood:  Anxious and Depressed  Affect:  NA  Thought Process:  Coherent  Orientation:  Full (Time, Place, and Person)  Thought Content:  WDL  Suicidal Thoughts:  No  Homicidal Thoughts:  No  Memory:  Immediate;   Fair Recent;   Fair  Judgement:  Fair  Insight:  Fair  Psychomotor  Activity:  Normal  Concentration:  Concentration: Fair  Recall:  FiservFair  Fund of Knowledge:Fair  Language: Fair  Akathisia:  No  Handed:  Right  AIMS (if indicated):    Assets:  Communication Skills Desire for Improvement Resilience Social Support  ADL's:  Intact  Cognition: WNL  Sleep:  Poor   Screenings: PHQ2-9     Nutrition from 07/12/2015 in [facility]  PHQ-2 Total Score  0      Assessment and Plan:  Admitted to intensive outpatient programming Will initiate Zoloft 25 mg p.o. daily escalation to 50 mg p.o. daily Will initiate Vistaril 25 mg p.o. nightly as needed for anxiety  Treatment plan was reviewed and agreed upon by NP T. Melvyn NethLewis and patient Ree Shayanika Black months need for group services  Oneta Rackanika N Bohdan Macho, NP 6/17/20209:08 AM

## 2019-06-17 ENCOUNTER — Other Ambulatory Visit (HOSPITAL_COMMUNITY): Payer: 59 | Admitting: Psychiatry

## 2019-06-17 ENCOUNTER — Other Ambulatory Visit: Payer: Self-pay

## 2019-06-17 ENCOUNTER — Encounter (HOSPITAL_COMMUNITY): Payer: Self-pay

## 2019-06-17 DIAGNOSIS — F331 Major depressive disorder, recurrent, moderate: Secondary | ICD-10-CM | POA: Diagnosis not present

## 2019-06-17 DIAGNOSIS — F320 Major depressive disorder, single episode, mild: Secondary | ICD-10-CM

## 2019-06-17 NOTE — Progress Notes (Signed)
Virtual Visit via Video Note  I connected with Katherine Hood on 06/17/19 at  9:00 AM EDT by a video enabled telemedicine application and verified that I am speaking with the correct person using two identifiers.  Location: Patient: Katherine Hood Provider: Lise Auer   I discussed the limitations of evaluation and management by telemedicine and the availability of in person appointments. The patient expressed understanding and agreed to proceed.  History of Present Illness: Current, Mild, MDD   Observations/Objective: Case Manager checked in with all participants to review discharge dates, insurance authorizations, work-related documents and needs for the treatment team. Counselor engaged the group in an therapeutic activity of making Gratitude Statements and shared the benefits of this practice. Katherine Hood shared insightful responses with the group. Counselor introduced guest speaker, Jeanella Craze, Lead [employer], to facilitate a discussion around Grief and Loss topics. She shared about the losses in identity and time in becoming a mom. Counselor prompted the group to journal about the G&L issues they would like to further process with their individual therapist. Counselor provided the group with 25 coping skills in better managing anxiety. Katherine Hood was very engaged, taking notes and displaying processing cues. Counselor introduced Field seismologist, Jan Fireman, Yoga Instructor, to guide the group in a yoga practice. Katherine Hood reported feeling more relaxed and less tense, enjoying the activity. Counselor checked in with all participants to assess the benefits. Counselor closed by having each share one coping skill they would like to apply over the next week. Katherine Hood would like to write a letter to her future self.   Assessment and Plan: Counselor recommends that Katherine Hood remains in IOP treatment to better manage mental health symptoms and continue to address treatment plan goals. Counselor  recommends adherence to crisis/safety plan, taking medications as prescribed and following up with medical professionals if any issues arise.   Follow Up Instructions: Counselor will send Webex link for next session.    I discussed the assessment and treatment plan with the patient. The patient was provided an opportunity to ask questions and all were answered. The patient agreed with the plan and demonstrated an understanding of the instructions.   The patient was advised to call back or seek an in-person evaluation if the symptoms worsen or if the condition fails to improve as anticipated.  I provided 180 minutes of non-face-to-face time during this encounter.   Lise Auer, LCSW

## 2019-06-18 ENCOUNTER — Other Ambulatory Visit: Payer: Self-pay

## 2019-06-18 ENCOUNTER — Other Ambulatory Visit (HOSPITAL_COMMUNITY): Payer: 59 | Admitting: Psychiatry

## 2019-06-21 ENCOUNTER — Encounter (HOSPITAL_COMMUNITY): Payer: Self-pay

## 2019-06-21 ENCOUNTER — Other Ambulatory Visit: Payer: Self-pay

## 2019-06-21 ENCOUNTER — Other Ambulatory Visit (HOSPITAL_COMMUNITY): Payer: 59 | Admitting: Psychiatry

## 2019-06-21 DIAGNOSIS — F331 Major depressive disorder, recurrent, moderate: Secondary | ICD-10-CM

## 2019-06-21 NOTE — Progress Notes (Signed)
Virtual Visit via Video Note  I connected with Katherine Hood on 06/21/19 at  9:00 AM EDT by a video enabled telemedicine application and verified that I am speaking with the correct person using two identifiers.  Location: Patient: Katherine Hood Provider: Lise Auer, LCSW  History of Present Illness: MDD, recurrent, moderate   Observations/Objective: Case Manager checked in with all participants to review discharge dates, insurance authorizations, work-related documents and needs for the treatment team. Counselor prompted all group members to share how they applied Anxiety Management Skills learned on Friday over the weekend. Each member shared very intentional and inspirational ways they applied the skills and saw positive results. Katherine Hood shared about exposing herself to getting out of the house, staying in the car, wearing a face mask to be able to reach her goal of shopping independently in the future. Counselor then shared a TedTalk by Almond Lint on Shame and Vulnerability.  Counselor allowed space for group members to share their [place]. Katherine Hood shared about how she wants to develop more in these areas within relationships. Counselor walked the group through creating ECOMAPs about their current relationships and connections. Each group member shared what they created and how it is impacting their mental health and overall wellness. Katherine Hood identified that she has a strong support system and needs to finad a better way to manage work stressors and to ask for help. Counselor spent the last 10 minutes allowing all to share encouraging words and inspirations gathered from one of the group members who graduates today. The graduate was touched and all members shared thoughtful good-byes.   Assessment and Plan: Counselor recommends that Katherine Hood remains in IOP treatment to better manage mental health symptoms and continue to address treatment plan goals. Counselor recommends adherence to  crisis/safety plan, taking medications as prescribed and following up with medical professionals if any issues arise.   Follow Up Instructions: Counselor will send Webex link for next session.    I discussed the assessment and treatment plan with the patient. The patient was provided an opportunity to ask questions and all were answered. The patient agreed with the plan and demonstrated an understanding of the instructions.   The patient was advised to call back or seek an in-person evaluation if the symptoms worsen or if the condition fails to improve as anticipated.  I provided 180 minutes of non-face-to-face time during this encounter.   Lise Auer, LCSW

## 2019-06-22 ENCOUNTER — Other Ambulatory Visit: Payer: Self-pay

## 2019-06-22 ENCOUNTER — Other Ambulatory Visit (HOSPITAL_COMMUNITY): Payer: 59 | Admitting: Family

## 2019-06-22 DIAGNOSIS — F331 Major depressive disorder, recurrent, moderate: Secondary | ICD-10-CM

## 2019-06-22 NOTE — Progress Notes (Signed)
Virtual Visit via Video Note  I connected with Katherine Hood on 06/22/19 at  9:00 AM EDT by a video enabled telemedicine application and verified that I am speaking with the correct person using two identifiers.   I discussed the limitations of evaluation and management by telemedicine and the availability of in person appointments. The patient expressed understanding and agreed to proceed.  I discussed the assessment and treatment plan with the patient. The patient was provided an opportunity to ask questions and all were answered. The patient agreed with the plan and demonstrated an understanding of the instructions.   The patient was advised to call back or seek an in-person evaluation if the symptoms worsen or if the condition fails to improve as anticipated.  I provided 180 minutes of non-face-to-face time during this encounter.   [name], LCSW     Daily Group Progress Note  Program: IOP  Group Time: 9am-12pm  Participation Level: Active  Behavioral Response: Appropriate, Sharing and Motivated  Type of Therapy:  Group Therapy; process group, psycho-educational group  Summary of Progress:  9am-10am Clinician checked in with group members, assessing for SI/HI/psychosis and overall level of functioning. Clinician and group members discussed completed self-care activities and attempted skills from previous day. Clinician and group members practiced the use of vulnerability in processing insecurities, relationship unhealthy relationship dynamics, and willful behaviors. Clinician praised group members for sharing.  10am-12pm Clinician presented the topic of Willingness vs Willfulness. Clinician utilized breathing activity demonstrating willfulness vs willingness. Clinician and group members discussed areas where clients are willful and where they would like to become more willing. Clinician presented the skills of Half-Smiling and Willing Hands. Clinician and group members  practiced Box Breathing skill to practice mindfulness. Clinician encouraged practicing a skill daily to gain mastery.  Client engaged in group discussions and activities. Client shared increasing activities outside of the home and being able to manage the anxiety fairly well. Client reports finding the breathing exercise relaxing. Client identified she often does half smiling and willing hands at work when frustrated with customers already without realizing she was completing a skill. Client discussed addressing mind wandering while driving by singing songs to help focus breathing.   [name], LCSW

## 2019-06-23 ENCOUNTER — Encounter (HOSPITAL_COMMUNITY): Payer: Self-pay | Admitting: Family

## 2019-06-23 ENCOUNTER — Other Ambulatory Visit (HOSPITAL_COMMUNITY): Payer: 59 | Admitting: Psychiatry

## 2019-06-23 ENCOUNTER — Other Ambulatory Visit: Payer: Self-pay

## 2019-06-23 ENCOUNTER — Telehealth (HOSPITAL_COMMUNITY): Payer: Self-pay | Admitting: Psychiatry

## 2019-06-23 NOTE — Progress Notes (Signed)
Virtual Visit via Telephone Note  I connected with Katherine Hood on 06/23/19 at  9:00 AM EDT by telephone and verified that I am speaking with the correct person using two identifiers.   I discussed the limitations, risks, security and privacy concerns of performing an evaluation and management service by telephone and the availability of in person appointments. I also discussed with the patient that there may be a patient responsible charge related to this service. The patient expressed understanding and agreed to proceed.   I discussed the assessment and treatment plan with the patient. The patient was provided an opportunity to ask questions and all were answered. The patient agreed with the plan and demonstrated an understanding of the instructions.   The patient was advised to call back or seek an in-person evaluation if the symptoms worsen or if the condition fails to improve as anticipated.  I provided 15 minutes of non-face-to-face time during this encounter.   Derrill Center, NP   [facility] MD/PA/NP OP Progress Note  06/23/2019 8:03 AM OTISHA SPICKLER  MRN:  [id number]  Evaluation: Katherine Hood recently started intensive outpatient programming for worsening depression and anxiety related to multiple psychosocial stressors.  Reported main stressor was related to her employer.  Patient was initiated on Zoloft 25 mg with titration to 50 mg daily.  Vistaril 25 mg PRN was made available.  Tomeko reports taking and tolerating medications well.  Denies medication side effects i.e. headache nausea vomiting or dizziness.  Reports her depression 3 out of 10 with 10 being the worst during this assessment.  She continues to deny suicidal or homicidal ideations.  Denies auditory or visual hallucinations.  Reports a good appetite.  States she is resting well throughout the night.  Support, encouragement and reassurance was provided.   Visit Diagnosis:    ICD-10-CM   1. MDD (major depressive disorder),  recurrent episode, moderate ([place])  F33.1     Past Psychiatric History:   Past Medical History:  Past Medical History:  Diagnosis Date  . Anxiety   . Depression   . Gestational diabetes   . Gestational diabetes mellitus 08/23/2015  . Gestational diabetes mellitus, antepartum   . Hx of varicella   . Hyperprolactinemia ([place])   . Infertility, female   . Medical history non-contributory   . PCOS (polycystic ovarian syndrome)   . Postpartum care following cesarean delivery (9/24) 09/23/2015    Past Surgical History:  Procedure Laterality Date  . CESAREAN SECTION N/A 09/23/2015   Procedure: CESAREAN SECTION;  Surgeon: Servando Salina, MD;  Location: [facility];  Service: Obstetrics;  Laterality: N/A;  . HERNIA REPAIR      Family Psychiatric History:   Family History:  Family History  Problem Relation Age of Onset  . Diabetes Mother   . Cancer Mother   . Heart disease Mother   . Depression Mother   . Diabetes Father   . Endometriosis Sister   . Hypertension Maternal Grandmother     Social History:  Social History   Socioeconomic History  . Marital status: Married    Spouse name: Not on file  . Number of children: Not on file  . Years of education: Not on file  . Highest education level: Not on file  Occupational History  . Not on file  Social Needs  . Financial resource strain: Not on file  . Food insecurity    Worry: Not on file    Inability: Not on file  . Transportation needs  Medical: Not on file    Non-medical: Not on file  Tobacco Use  . Smoking status: Never Smoker  . Smokeless tobacco: Never Used  Substance and Sexual Activity  . Alcohol use: No  . Drug use: No  . Sexual activity: Yes  Lifestyle  . Physical activity    Days per week: Not on file    Minutes per session: Not on file  . Stress: Not on file  Relationships  . Social Musicianconnections    Talks on phone: Not on file    Gets together: Not on file    Attends religious service: Not on file     Active member of club or organization: Not on file    Attends meetings of clubs or organizations: Not on file    Relationship status: Not on file  Other Topics Concern  . Not on file  Social History Narrative  . Not on file    Allergies: No Known Allergies  Metabolic Disorder Labs: No results found for: HGBA1C, MPG No results found for: PROLACTIN No results found for: CHOL, TRIG, HDL, CHOLHDL, VLDL, LDLCALC No results found for: TSH  Therapeutic Level Labs: No results found for: LITHIUM No results found for: VALPROATE No components found for:  CBMZ  Current Medications: Current Outpatient Medications  Medication Sig Dispense Refill  . hydrOXYzine (VISTARIL) 25 MG capsule Take 1 capsule (25 mg total) by mouth 3 times/day as needed-between meals & bedtime for anxiety. 30 capsule 0  . meclizine (ANTIVERT) 12.5 MG tablet Take 1 tablet (12.5 mg total) by mouth 3 (three) times daily as needed for dizziness. 30 tablet 0  . sertraline (ZOLOFT) 25 MG tablet Take 1 tablet (25 mg total) by mouth daily for 2 days, THEN 2 tablets (50 mg total) daily. 122 tablet 0  . sodium chloride (OCEAN) 0.65 % SOLN nasal spray Place 1 spray into both nostrils as needed for congestion. 60 mL 0  . triamcinolone (NASACORT) 55 MCG/ACT AERO nasal inhaler Place 2 sprays into the nose daily. 1 Inhaler 12   No current facility-administered medications for this visit.      Musculoskeletal:  Psychiatric Specialty Exam: ROS  unknown if currently breastfeeding.There is no height or weight on file to calculate BMI.  General Appearance: NA  Eye Contact:  NA  Speech:  Clear and Coherent  Volume:  Normal  Mood:  Anxious and Depressed  Affect:  Congruent  Thought Process:  Coherent  Orientation:  Full (Time, Place, and Person)  Thought Content: WDL   Suicidal Thoughts:  No  Homicidal Thoughts:  No  Memory:  Immediate;   Fair Recent;   Fair  Judgement:  Fair  Insight:  Fair  Psychomotor Activity:  Normal   Concentration:  Concentration: Fair  Recall:  FiservFair  Fund of Knowledge: Fair  Language: Fair  Akathisia:  No  Handed:  Right  AIMS (if indicated): not done  Assets:  Communication Skills Desire for Improvement Resilience Social Support  ADL's:  Intact  Cognition: WNL  Sleep:  Fair improving   Screenings: PHQ2-9     Nutrition from 07/12/2015 in [facility]  PHQ-2 Total Score  0       Assessment and Plan:  Continue intensive outpatient programming (IOP)  Depression: -Continue Zoloft 50 mg p.o. daily -Continue Vistaril 25 mg p.o. PRN  Treatment plan was reviewed and agreed upon by NP T. Melvyn NethLewis and patient Ladell Headsanika Fromm  need for continued group services.   Jerene Pitchanika N  Lewis, NP 06/23/2019, 8:03 AM

## 2019-06-24 ENCOUNTER — Other Ambulatory Visit: Payer: Self-pay

## 2019-06-24 ENCOUNTER — Other Ambulatory Visit (HOSPITAL_COMMUNITY): Payer: 59 | Admitting: Psychiatry

## 2019-06-24 DIAGNOSIS — F331 Major depressive disorder, recurrent, moderate: Secondary | ICD-10-CM | POA: Diagnosis not present

## 2019-06-25 ENCOUNTER — Other Ambulatory Visit: Payer: Self-pay

## 2019-06-25 ENCOUNTER — Encounter (HOSPITAL_COMMUNITY): Payer: Self-pay | Admitting: Psychiatry

## 2019-06-25 ENCOUNTER — Other Ambulatory Visit (HOSPITAL_COMMUNITY): Payer: 59 | Admitting: Licensed Clinical Social Worker

## 2019-06-25 DIAGNOSIS — F331 Major depressive disorder, recurrent, moderate: Secondary | ICD-10-CM | POA: Diagnosis not present

## 2019-06-25 NOTE — Progress Notes (Signed)
Virtual Visit via Video Note  I connected with Katherine Hood on 06/25/19 at  9:00 AM EDT by a video enabled telemedicine application and verified that I am speaking with the correct person using two identifiers.  Location: Patient: Katherine Hood Provider: Lise Auer, LCSW   I discussed the limitations of evaluation and management by telemedicine and the availability of in person appointments. The patient expressed understanding and agreed to proceed.  History of Present Illness: Major Depressive Disorder and Difficulty coping.    Observations/Objective: Case Manager checked in with all participants to review discharge dates, insurance authorizations, work-related documents and needs for the treatment team. Counselor engaged the group in sharing what is the wildest things they want to do or try. Katherine Hood shared about some of her fears and missed opportunities and how she'd like to be more brave and enjoy life more. Counselor facilitated a Chief Operating Officer, where group members shared about community, local, national and phone resources to support their mental health. Katherine Hood was thankful for the information and plans to contact some. Counselor led the group in an activity called "Pocket Coping Skills" where they make a list of coping skills they can use in various settings. Katherine Hood's list was amazing and she was very thoughtful in how she planned to apply the skills in the various settings. Each group member shared their lists and did a show and share on teaching one skill to the group. Counselor shared a counseling video on how generalized anxiety disorder and strategies for coping with the symptoms. Katherine Hood shared points she highlighted and how she would apply the knowledge, especially at work. One of the group members graduated from the program today, so we spent the last part of group sharing encouragement and feedback for their future endeavors.  Katherine Hood shared kind words with the graduate.    Assessment and Plan: Counselor recommends that Tanja remains in IOP treatment to better manage mental health symptoms and continue to address treatment plan goals. Counselor recommends adherence to crisis/safety plan, taking medications as prescribed and following up with medical professionals if any issues arise.   Follow Up Instructions: Counselor will send Webex link for next session.    I discussed the assessment and treatment plan with the patient. The patient was provided an opportunity to ask questions and all were answered. The patient agreed with the plan and demonstrated an understanding of the instructions.   The patient was advised to call back or seek an in-person evaluation if the symptoms worsen or if the condition fails to improve as anticipated.  I provided 180 minutes of non-face-to-face time during this encounter.   Lise Auer, LCSW

## 2019-06-28 ENCOUNTER — Other Ambulatory Visit: Payer: Self-pay

## 2019-06-28 ENCOUNTER — Other Ambulatory Visit (HOSPITAL_COMMUNITY): Payer: 59 | Admitting: Psychiatry

## 2019-06-28 ENCOUNTER — Encounter (HOSPITAL_COMMUNITY): Payer: Self-pay | Admitting: Psychiatry

## 2019-06-28 DIAGNOSIS — F331 Major depressive disorder, recurrent, moderate: Secondary | ICD-10-CM | POA: Diagnosis not present

## 2019-06-28 NOTE — Progress Notes (Signed)
Virtual Visit via Video Note  I connected with Katherine Hood on 06/28/19 at  9:00 AM EDT by a video enabled telemedicine application and verified that I am speaking with the correct person using two identifiers.  Location: Patient: Katherine Hood Provider: Lise Auer, LCSW   I discussed the limitations of evaluation and management by telemedicine and the availability of in person appointments. The patient expressed understanding and agreed to proceed.  History of Present Illness: MDD and work-related issues.    Observations/Objective: Case Manager checked in with all participants to review discharge dates, insurance authorizations, work-related documents and needs for the treatment team. Counselor checked in with all group members to assess how their weekends were and to introduce everyone to the new group member. Katherine Hood was feeling well today. Counselor introduced our guest speaker, Einar Grad, [place] Pharmacist, who shared about psychiatric medications, side effects, treatment considerations and how to communicate with medical professionals. Katherine Hood did not have any concerns with her medications. Counselor allowed time for group members to do a body scan to document concerns and questions they may have for their medical providers. She would like to talk about neck and back tension with a medical professional and will continue to explore its links with her stress. Group members shared their reflections with the group. Counselor walked group through a guided imagery. 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 Group members shared feedback on the experience for them. Counselor facilitated the creation of a crisis plan with group members for prevention and intervention during times of mental health concerns. Katherine Hood discussed her past instances with Panic Attacks and what would be helpful  to prevent and cope during those moments if they return. Counselor wrapped up group and had everyone share a self-care or productivity task can plan for today.   Assessment and Plan: Counselor recommends that Katherine Hood remains in IOP treatment to better manage mental health symptoms and continue to address treatment plan goals. Counselor recommends adherence to crisis/safety plan, taking medications as prescribed and following up with medical professionals if any issues arise.   Follow Up Instructions: Counselor will send Webex link for next session.    I discussed the assessment and treatment plan with the patient. The patient was provided an opportunity to ask questions and all were answered. The patient agreed with the plan and demonstrated an understanding of the instructions.   The patient was advised to call back or seek an in-person evaluation if the symptoms worsen or if the condition fails to improve as anticipated.  I provided 180 minutes of non-face-to-face time during this encounter.   Lise Auer, LCSW

## 2019-06-29 ENCOUNTER — Other Ambulatory Visit (HOSPITAL_COMMUNITY): Payer: 59 | Admitting: Licensed Clinical Social Worker

## 2019-06-29 ENCOUNTER — Other Ambulatory Visit: Payer: Self-pay

## 2019-06-29 DIAGNOSIS — F331 Major depressive disorder, recurrent, moderate: Secondary | ICD-10-CM | POA: Diagnosis not present

## 2019-06-29 NOTE — Progress Notes (Signed)
Virtual Visit via Video Note  I connected with Katherine Hood on 06/29/19 at 1400 by a video enabled telemedicine application and verified that I am speaking with the correct person using two identifiers.  I discussed the limitations of evaluation and management by telemedicine and the availability of in person appointments. The patient expressed understanding and agreed to proceed. I discussed the assessment and treatment plan with the patient. The patient was provided an opportunity to ask questions and all were answered. The patient agreed with the plan and demonstrated an understanding of the instructions.  The patient was advised to call back or seek an in-person evaluation if the symptoms worsen or if the condition fails to improve as anticipated.  I provided 20 minutes of non-face-to-face time during this encounter.  As previous note states: This is a 43 yr old, married, employed, [place] American female, who was referred per previous pt in [place]; treatment for worsening anxiety symptoms and depression.  Reports having panic attacks since March 2020; when she started working from home d/t COVID-19.  Most recent panic attack was this past Sunday.  Pt denies SI/HI or A/V hallucinations.  Stressors:  1) Job ([employer]) of 19 yrs.  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  "I haven't met quota since September 2019."  2)  Unresolved grief/loss issues:  Pt reports having an ectopic pregnancy in 2014.  States she and husband had been trying to conceive for two yrs; until they had a baby three yrs ago.  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  Pt has PCOS.  Pt denies any previous psychiatric admissions; saw a therapist in 2014.  Denies any hx of self-injurious behaviors.  Family hx:  Mother (Depression).  Pt will be completing MH-IOP on Thursday, 07-01-19.  Pt has attended all schedul ed days so far except for one day d/t illness.  Although overall mood has improved; pt  c/o anxiety symptoms.  "Sometimes my stomach is very upset and I have shortness of breath."  Pt admits she is applying the skills learned. 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  Denies SI/HI or A/V hallucinations. A:  D/C on 07-01-19.  F/U with Lise Auer, LCSW on 07-05-19 @ 3pm and Dr. Montel Culver on 07-06-19 @ 1pm.  Encouraged support groups.  RTW on 07-12-19; without any restrictions.  Pt is requesting an extension from work.  Pt was redirected to Dr. Audria Nine office.  R:  Pt receptive.    Carlis Abbott, RITA, M.Ed,CNA

## 2019-06-29 NOTE — Patient Instructions (Signed)
D:  Patient will complete MH-IOP on Thursday 07-01-19.  A:  Discharge patient on 07-01-19.  Follow up with Lise Auer, LCSW on 07-05-19 @ 3pm and Dr. Montel Culver on 07-06-19 @ 1pm.  Encouraged support groups.  Return to work on 07-12-19; without any restrictions.

## 2019-06-30 ENCOUNTER — Encounter (HOSPITAL_COMMUNITY): Payer: Self-pay

## 2019-06-30 ENCOUNTER — Other Ambulatory Visit: Payer: Self-pay

## 2019-06-30 ENCOUNTER — Other Ambulatory Visit (HOSPITAL_COMMUNITY): Payer: 59 | Attending: Psychiatry | Admitting: Psychiatry

## 2019-06-30 DIAGNOSIS — F329 Major depressive disorder, single episode, unspecified: Secondary | ICD-10-CM | POA: Diagnosis present

## 2019-06-30 DIAGNOSIS — F331 Major depressive disorder, recurrent, moderate: Secondary | ICD-10-CM

## 2019-06-30 DIAGNOSIS — F320 Major depressive disorder, single episode, mild: Secondary | ICD-10-CM

## 2019-06-30 NOTE — Progress Notes (Signed)
Virtual Visit via Video Note  I connected with Katja R Dorris on 06/30/19 at  9:00 AM EDT by a video enabled telemedicine application and verified that I am speaking with the correct person using two identifiers.  Location: Patient: Katherine Hood Provider: Lise Auer, LCSW   I discussed the limitations of evaluation and management by telemedicine and the availability of in person appointments. The patient expressed understanding and agreed to proceed.  History of Present Illness: MDD and Difficulty coping.    Observations/Objective: Case Manager was on vacation today, so Counselor checked in with all participants to review discharge dates, insurance authorizations, work-related documents and needs for the treatment team. Counselor engaged the group by checking in to determine their current mood and state, knowing that 2 are graduating tomorrow. Tahnee shared background to what led up to her needing treatment and how her anxiety has improved over the past two weeks. Counselor introduced guest speaker, Jeanella Craze, Lead [employer], to facilitate a discussion around Grief and Loss topics. Jayme shared about the losses she is experiencing due to COVID restrictions. Counselor prompted the group to journal about the G&L issues they would like to further process with their individual therapist. Counselor provided the group with 15 Cognitive Distortions or Distorted Thinking Styles. Serita connected with the "Shoulds" and "Black and White" Thinking. Counselor allowed all group members to process how their thinking is distorted at times. Counselor challenged each group member to pick one Cognitive Distortion to be aware of this week and to practice rational thinking instead. Counselor closed by asking a Destination Anywhere Question and by having group members share a self-care task and productivity activity they will do today.  Olena plans to work out from home today.   Assessment and  Plan: Counselor recommends that Evana remains in IOP treatment to better manage mental health symptoms and continue to address treatment plan goals. Counselor recommends adherence to crisis/safety plan, taking medications as prescribed and following up with medical professionals if any issues arise.   Follow Up Instructions: Counselor will send Webex link for next session.    I discussed the assessment and treatment plan with the patient. The patient was provided an opportunity to ask questions and all were answered. The patient agreed with the plan and demonstrated an understanding of the instructions.   The patient was advised to call back or seek an in-person evaluation if the symptoms worsen or if the condition fails to improve as anticipated.  I provided 180 minutes of non-face-to-face time during this encounter.   Lise Auer, LCSW

## 2019-06-30 NOTE — Progress Notes (Signed)
  Virtual Visit via Telephone Note  I connected with Katherine Hood on 06/30/19 at  9:00 AM EDT by telephone and verified that I am speaking with the correct person using two identifiers.   I discussed the limitations, risks, security and privacy concerns of performing an evaluation and management service by telephone and the availability of in person appointments. I also discussed with the patient that there may be a patient responsible charge related to this service. The patient expressed understanding and agreed to proceed.    I discussed the assessment and treatment plan with the patient. The patient was provided an opportunity to ask questions and all were answered. The patient agreed with the plan and demonstrated an understanding of the instructions.   The patient was advised to call back or seek an in-person evaluation if the symptoms worsen or if the condition fails to improve as anticipated.  I provided 15 minutes of non-face-to-face time during this encounter.   Derrill Center, NP   [facility] Discharge Summary  Katherine Hood [id number]  Admission date: 06/15/2019 Discharge date: 07/01/2019  Reason for admission: Worsening depression -Per assessment note:Katherine Hood 43 year old African-American female presents after referral from previous patient. She reports she is currently employed by [employer] which is causing most of her depression and stress related symptoms.  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  Reports she had plans to follow-up with [employer] at work however will follow-up later this week.  She reports more recently due to [place] causing symptoms of worry and social anxiety as intensified.  States that she is currently working from home however has fears a going out in public.  Reports difficulty concentrating, low energy and  wanting to rest most of the day.  Braxton  stated increased anxiety.  Chemical Use History: Denied  Family of Origin Issues: Denied continues to express her husband has been supportive during her outpatient admission.  Progress in Program Toward Treatment Goals: Ongoing, clinic attended and participated with daily group sessions with active and engaged participation.  Patient was initiated on Vistaril 25mg  and Zoloft 25 with titration to 50 mg p.o. daily.  Patient reported taking and tolerating medications well.  Rating her depression 3 out of 10 with 10 being the worst during this assessment.  Progress (rationale): Keep follow-up with Dr.Pucilowski 07/06/2019 and therapist Lise Auer 07/05/2019   Take all medications as prescribed. Keep all follow-up appointments as scheduled.  Do not consume alcohol or use illegal drugs while on prescription medications. Report any adverse effects from your medications to your primary care provider promptly.  In the event of recurrent symptoms or worsening symptoms, call 911, a crisis hotline, or go to the nearest emergency department for evaluation.    Derrill Center, NP 06/30/2019

## 2019-07-01 ENCOUNTER — Other Ambulatory Visit: Payer: Self-pay

## 2019-07-01 ENCOUNTER — Encounter (HOSPITAL_COMMUNITY): Payer: Self-pay | Admitting: Family

## 2019-07-01 ENCOUNTER — Other Ambulatory Visit (HOSPITAL_COMMUNITY): Payer: 59 | Admitting: Psychiatry

## 2019-07-01 DIAGNOSIS — F329 Major depressive disorder, single episode, unspecified: Secondary | ICD-10-CM | POA: Diagnosis not present

## 2019-07-01 DIAGNOSIS — F331 Major depressive disorder, recurrent, moderate: Secondary | ICD-10-CM

## 2019-07-01 NOTE — Progress Notes (Signed)
Virtual Visit via Video Note  I connected with Katherine Hood on 07/01/19 at  9:00 AM EDT by a video enabled telemedicine application and verified that I am speaking with the correct person using two identifiers.  Location: Patient: Katherine Hood Provider: Lise Auer, LCSW   I discussed the limitations of evaluation and management by telemedicine and the availability of in person appointments. The patient expressed understanding and agreed to proceed.  History of Present Illness: MDD and Difficulty coping.    Observations/Objective: Case Manager was on vacation today, so Counselor checked in with all participants to review discharge dates, insurance authorizations, work-related documents and needs for the treatment team. Katherine Hood is graduating today. Counselor engaged the group by checking in to determine who completed their self-care act and to see everyone's current state and mood. Katherine Hood was able to work out as desired yesterday and felt good about her self-care. Counselor engaged group members in providing them with psychoeducation on Self-Compassion concepts and self-soothing activities. Katherine Hood really enjoyed the self-soothing and positive affirmations combination and will apply it at work and home. Group members provided feedback on material and gave examples of how it applies to their lives. Counselor spent time allowing group members to share encouraging and inspirational words for the group members who are graduating today. Katherine Hood received the encouragement and positive messages from group members, as well as sharing kind and encouraging words to the other group member graduating. Counselor introduced Field seismologist, Jan Fireman, Yoga Instructor, to guide the group in a yoga practice. Katherine Hood enjoyed Yoga and stated she felt a needed stretch from her work out yesterday. Katherine Hood shared gratitude for lessons and skills learned in group and is excited to start individual therapy.    Assessment and Plan: Counselor recommends that [place] discharge from IOP treatment to individual therapy to continue management of mental health symptoms and to continue to address treatment plan goals. Counselor recommends adherence to crisis/safety plan, taking medications as prescribed and following up with medical professionals if any issues arise.   Follow Up Instructions: Attend upcoming appointment with individual therapist.   The patient was advised to call back or seek an in-person evaluation if the symptoms worsen or if the condition fails to improve as anticipated.  I provided 180 minutes of non-face-to-face time during this encounter.   Lise Auer, LCSW

## 2019-07-05 ENCOUNTER — Ambulatory Visit (INDEPENDENT_AMBULATORY_CARE_PROVIDER_SITE_OTHER): Payer: 59 | Admitting: Psychiatry

## 2019-07-05 ENCOUNTER — Ambulatory Visit (HOSPITAL_COMMUNITY): Payer: 59

## 2019-07-05 ENCOUNTER — Other Ambulatory Visit: Payer: Self-pay

## 2019-07-05 DIAGNOSIS — F320 Major depressive disorder, single episode, mild: Secondary | ICD-10-CM

## 2019-07-06 ENCOUNTER — Encounter (HOSPITAL_COMMUNITY): Payer: Self-pay | Admitting: Psychiatry

## 2019-07-06 ENCOUNTER — Ambulatory Visit (HOSPITAL_COMMUNITY): Payer: 59

## 2019-07-06 ENCOUNTER — Ambulatory Visit (INDEPENDENT_AMBULATORY_CARE_PROVIDER_SITE_OTHER): Payer: 59 | Admitting: Psychiatry

## 2019-07-06 ENCOUNTER — Other Ambulatory Visit: Payer: Self-pay

## 2019-07-06 DIAGNOSIS — F331 Major depressive disorder, recurrent, moderate: Secondary | ICD-10-CM

## 2019-07-06 DIAGNOSIS — F3342 Major depressive disorder, recurrent, in full remission: Secondary | ICD-10-CM | POA: Insufficient documentation

## 2019-07-06 DIAGNOSIS — F413 Other mixed anxiety disorders: Secondary | ICD-10-CM | POA: Diagnosis not present

## 2019-07-06 MED ORDER — HYDROXYZINE HCL 10 MG PO TABS: 10.0000 mg | ORAL_TABLET | Freq: Three times a day (TID) | ORAL | 0 refills | Status: DC | PRN

## 2019-07-06 MED ORDER — SERTRALINE HCL 50 MG PO TABS: 50.0000 mg | ORAL_TABLET | Freq: Every day | ORAL | 0 refills | Status: DC

## 2019-07-06 NOTE — Progress Notes (Signed)
Anxiety disorder

## 2019-07-06 NOTE — Progress Notes (Signed)
  Virtual Visit via Video Note  I connected with Katherine Hood on 06/25/2019 at  9:00 AM EDT by a video enabled telemedicine application and verified that I am speaking with the correct person using two identifiers.   I discussed the limitations of evaluation and management by telemedicine and the availability of in person appointments. The patient expressed understanding and agreed to proceed.  I discussed the assessment and treatment plan with the patient. The patient was provided an opportunity to ask questions and all were answered. The patient agreed with the plan and demonstrated an understanding of the instructions.   The patient was advised to call back or seek an in-person evaluation if the symptoms worsen or if the condition fails to improve as anticipated.  I provided 180 minutes of non-face-to-face time during this encounter.   [name], LCSW    Daily Group Progress Note  Program: IOP  Group Time: 9am-12pm  Participation Level: Active  Behavioral Response: Appropriate  Type of Therapy:  Group Therapy; process group, psycho-educational group  Clinician and group members groups of coping skills including distraction, grounding, emotional release, self love, thought challenging, and the pros and cons of where each might be useful.  Clinician and group members discuss DBT skill Letting Go of Emotional Suffering: Mindfulness of Your Current Emotions and 'Riding the wave' of uncomfortable emotions. Clinician and group members practiced sensory solutions for calming down including meditation, affirmations, guided visualization, exercise, and journaling.    Summary of Progress: Client engaged in discussions appropriately, identifying how each skill could be used in her daily life. Client is increasing adding mindfulness into her daily life.  [name], LCSW

## 2019-07-06 NOTE — Progress Notes (Signed)
Virtual Visit via Video Note  I connected with Belanna R Pfund on 07/06/19 at  3:00 PM EDT by a video enabled telemedicine application and verified that I am speaking with the correct person using two identifiers.  Location: Patient: Katherine Hood Provider: Lise Auer, LCSW   I discussed the limitations of evaluation and management by telemedicine and the availability of in person appointments. The patient expressed understanding and agreed to proceed.  History of Present Illness: MDD, mild   Observations/Objective: Counselor met with Kiran for individual therapy via Webex. Counselor assessed MH symptoms and progress on treatment plan goals. Marlie denied suicidal ideation or self-harm behaviors. Shevonne shared that she truly enjoyed her time in IOP group therapy, that it was refreshing to know that there were other people who were dealing with similar mental health concerns. We processed her learning and application of the skills. Deshawnda identified that she was starting to get anxious about returning to work. Counselor used CBT and MI interventions to process through her return to work step by step, how she can prepare, combat anxiety, think more realistically and rationally about her abilities and capabilities on the job. We identified ways she can advocate for herself and others on the job. She identified ways she could adjust her routine to be in a better mindset for work. We discussed communication and boundary setting skills. Counselor and Idell planned for future sessions.   Assessment and Plan: Counselor will continue to meet with Audelia to address treatment plan goals. Laquilla will continue to follow recommendations of providers and implement skills learned in session.  Follow Up Instructions: Counselor will send information for next session via Webex.     I discussed the assessment and treatment plan with the patient. The patient was provided an opportunity to ask questions  and all were answered. The patient agreed with the plan and demonstrated an understanding of the instructions.   The patient was advised to call back or seek an in-person evaluation if the symptoms worsen or if the condition fails to improve as anticipated.  I provided 60 minutes of non-face-to-face time during this encounter.   Lise Auer, LCSW

## 2019-07-06 NOTE — Progress Notes (Addendum)
BH MD/PA/NP OP Progress Note  07/06/2019 1:28 PM Katherine Hood  MRN:  [id number] Interview was conducted using WebEx teleconferencing application and I verified that I was speaking with the correct person using two identifiers. I discussed the limitations of evaluation and management by telemedicine and  the availability of in person appointments. Patient expressed understanding and agreed to proceed.  Chief Complaint: Anxiety, depression.  HPI:  43 yo married AA female who has experience increase in anxiety and recurrence of depression within past two months. She has a hx of post-partum depression but has never been treated for it. 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 She has panic attacks as well as generalized type anxiety. She has no hx of feeling suicidal, hopeless or having manic/hypomanic episodes. There is no hx of psychosis. She does not abuse alcohol or street drugs. Because of mood decline she entered IOP at [facility] on  06/15/19 and completed program on 07/01/19. She will start individual counseling with Hilbert OdorBethany Morris LCSW and medication management visits with me. While in IOP she was started on sertraline 25 mg which she has taken for a week now. She also was prescribed hydroxyzine 25 mg prn anxiety. When she took it anxiety decreased but she also reported feeling sedated afterwards. She is currently on FMLA which expires on  07/12/19 - she does not feel quite ready yet to return to work.  Visit Diagnosis:    ICD-10-CM   1. Major depressive disorder, recurrent episode, moderate (HCC)  F33.1   2. 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 Lewis FNP from 06/15/19 as well as above.  Past Medical History:  Past Medical History:  Diagnosis Date  . Anxiety   . Depression   . Gestational diabetes   . Gestational diabetes mellitus  08/23/2015  . Gestational diabetes mellitus, antepartum   . Hx of varicella   . Hyperprolactinemia (HCC)   . Infertility, female   . Medical history non-contributory   . PCOS (polycystic ovarian syndrome)   . Postpartum care following cesarean delivery (9/24) 09/23/2015    Past Surgical History:  Procedure Laterality Date  . CESAREAN SECTION N/A 09/23/2015   Procedure: CESAREAN SECTION;  Surgeon: Maxie BetterSheronette Cousins, MD;  Location: [facility];  Service: Obstetrics;  Laterality: N/A;  . HERNIA REPAIR      Family Psychiatric History: Reviewed.  Family History:  Family History  Problem Relation Age of Onset  . Diabetes Mother   . Cancer Mother   . Heart disease Mother   . Depression Mother   . Diabetes Father   . Endometriosis Sister   . Depression Sister   . Hypertension Maternal Grandmother     Social History:  Social History   Socioeconomic History  . Marital status: Married    Spouse name: Not on file  . Number of children: Not on file  . Years of education: Not on file  . Highest education level: Not on file  Occupational History  . Not on file  Social Needs  . Financial resource strain: Not on file  . Food insecurity    Worry: Not on file    Inability: Not on file  . Transportation needs    Medical: Not on file    Non-medical: Not on file  Tobacco Use  . Smoking status: Never Smoker  . Smokeless tobacco:  Never Used  Substance and Sexual Activity  . Alcohol use: No  . Drug use: No  . Sexual activity: Yes  Lifestyle  . Physical activity    Days per week: Not on file    Minutes per session: Not on file  . Stress: Not on file  Relationships  . Social Musicianconnections    Talks on phone: Not on file    Gets together: Not on file    Attends religious service: Not on file    Active member of club or organization: Not on file    Attends meetings of clubs or organizations: Not on file    Relationship status: Not on file  Other Topics Concern  . Not on file  Social  History Narrative  . Not on file    Allergies: No Known Allergies  Metabolic Disorder Labs: No results found for: HGBA1C, MPG No results found for: PROLACTIN No results found for: CHOL, TRIG, HDL, CHOLHDL, VLDL, LDLCALC No results found for: TSH  Therapeutic Level Labs: No results found for: LITHIUM No results found for: VALPROATE No components found for:  CBMZ  Current Medications: Current Outpatient Medications  Medication Sig Dispense Refill  . meclizine (ANTIVERT) 12.5 MG tablet Take 1 tablet (12.5 mg total) by mouth 3 (three) times daily as needed for dizziness. 30 tablet 0  . sertraline (ZOLOFT) 25 MG tablet Take 1 tablet (25 mg total) by mouth daily for 2 days, THEN 2 tablets (50 mg total) daily. 122 tablet 0  . sodium chloride (OCEAN) 0.65 % SOLN nasal spray Place 1 spray into both nostrils as needed for congestion. 60 mL 0  . triamcinolone (NASACORT) 55 MCG/ACT AERO nasal inhaler Place 2 sprays into the nose daily. 1 Inhaler 12   No current facility-administered medications for this visit.     Psychiatric Specialty Exam: Review of Systems  Neurological: Positive for headaches.  Psychiatric/Behavioral: Positive for depression. The patient is nervous/anxious.   All other systems reviewed and are negative.   unknown if currently breastfeeding.There is no height or weight on file to calculate BMI.  General Appearance: Casual and Well Groomed  Eye Contact:  Good  Speech:  Clear and Coherent and Normal Rate  Volume:  Normal  Mood:  Anxious and Depressed  Affect:  Constricted  Thought Process:  Goal Directed  Orientation:  Full (Time, Place, and Person)  Thought Content: Logical   Suicidal Thoughts:  No  Homicidal Thoughts:  No  Memory:  Immediate;   Good Recent;   Good Remote;   Good  Judgement:  Fair  Insight:  Good  Psychomotor Activity:  Normal  Concentration:  Concentration: Fair  Recall:  Good  Fund of Knowledge: Good  Language: Good  Akathisia:   Negative  Handed:  Right  AIMS (if indicated): not done  Assets:  Communication Skills Desire for Improvement Housing Physical Health Social Support Talents/Skills Transportation  ADL's:  Intact  Cognition: WNL  Sleep:  Good   Screenings: PHQ2-9     Nutrition from 07/12/2015 in [facility]  PHQ-2 Total Score  0       Assessment and Plan: 43 yo married AA female who has experience increase in anxiety and recurrence of depression within past two months. She has a hx of post-partum depression but has never been treated for it. 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 She has panic attacks as well  as generalized type anxiety. She has no hx of feeling suicidal, hopeless or having manic/hypomanic episodes. There is no hx of psychosis. She does not abuse alcohol or street drugs. Because of mood decline she entered IOP at [facility] on  06/15/19 and completed program on 07/01/19. She will start individual counseling with Lise Auer LCSW and medication management visits with me. While in IOP she was started on sertraline 25 mg which she has taken for a week now. She also was prescribed hydroxyzine 25 mg prn anxiety. When she took it anxiety decreased but she also reported feeling sedated afterwards. She is currently on FMLA which expires on  07/12/19 - she does not feel quite ready yet to return to work.  Plan: Jasa will increase dose of sertraline to 50 mg daily beginning tomorrow. I will decrease dose of hydroxyzine to 10-20 mg tid prn anxiety to minimize sedation. We will meet again in 2 weeks (07/30/19) to assess her readiness to return to work - tentative plan is to extend her FMLA till August 2nd - return to work on Monday 08/02/19. The plan was discussed with patient who had an opportunity to ask questions and these were all answered. I spend 45 minutes in  videoconference consultation with the patient and devoted approximately 50% of this time to explanation of diagnosis, discussion of treatment options and med education.   Stephanie Acre, MD 07/06/2019, 1:28 PM

## 2019-07-06 NOTE — Progress Notes (Signed)
Virtual Visit via Video Note  I connected with Katherine Hood on 06/29/2019 at  9:00 AM EDT by a video enabled telemedicine application and verified that I am speaking with the correct person using two identifiers.   I discussed the limitations of evaluation and management by telemedicine and the availability of in person appointments. The patient expressed understanding and agreed to proceed.  I discussed the assessment and treatment plan with the patient. The patient was provided an opportunity to ask questions and all were answered. The patient agreed with the plan and demonstrated an understanding of the instructions.   The patient was advised to call back or seek an in-person evaluation if the symptoms worsen or if the condition fails to improve as anticipated.  I provided 180 minutes of non-face-to-face time during this encounter.   [name], LCSW     Daily Group Progress Note  Program: IOP  Group Time: 9am-12pm  Participation Level: Active  Behavioral Response: Appropriate  Type of Therapy:  Group Therapy; process group, psycho-educational group  Summary of Progress: Clinician met with group members, assessing for SI/HI/psychosis and overall level of functioning. Clinician and group members processed vulnerability with topics of events which will cause distress after discharge from program. Clinician actively listened to discussion and praised clients for supportive feedback in discussion.  Clinician and clients reviewed Wheel of Life. Clinician provided group members with psychoeducational material on self care wheel. Clinician and clients created unique coping skills list.   Summary of Progress: Client identified she has increased exercise and added meditation multiple times weekly to help manage anxiety. Client does want to add more 'fun and recreation' into her life but is overall very satisfied with support from friends and family. Client shared some ongoing  anxiety, which she is able to utilize coping skills.  [name], LCSW

## 2019-07-07 ENCOUNTER — Ambulatory Visit (HOSPITAL_COMMUNITY): Payer: 59

## 2019-07-08 ENCOUNTER — Ambulatory Visit (HOSPITAL_COMMUNITY): Payer: 59

## 2019-07-09 ENCOUNTER — Ambulatory Visit (HOSPITAL_COMMUNITY): Payer: 59

## 2019-07-30 ENCOUNTER — Other Ambulatory Visit: Payer: Self-pay

## 2019-07-30 ENCOUNTER — Ambulatory Visit (INDEPENDENT_AMBULATORY_CARE_PROVIDER_SITE_OTHER): Payer: 59 | Admitting: Psychiatry

## 2019-07-30 DIAGNOSIS — F3342 Major depressive disorder, recurrent, in full remission: Secondary | ICD-10-CM | POA: Diagnosis not present

## 2019-07-30 DIAGNOSIS — F413 Other mixed anxiety disorders: Secondary | ICD-10-CM

## 2019-07-30 MED ORDER — SERTRALINE HCL 50 MG PO TABS: 50.0000 mg | ORAL_TABLET | Freq: Every day | ORAL | 1 refills | Status: DC

## 2019-07-30 NOTE — Progress Notes (Signed)
BH MD/PA/NP OP Progress Note  07/30/2019 10:08 AM Katherine Hood  MRN:  [id number] Interview was conducted by phone and I verified that I was speaking with the correct person using two identifiers. I discussed the limitations of evaluation and management by telemedicine and  the availability of in person appointments. Patient expressed understanding and agreed to proceed.  Chief Complaint: None  HPI: 43 yo married AA female who has experience increase in anxiety and recurrence of depression within past two months. She has a hx of post-partum depression but has never been treated for it. 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 She has panic attacks as well as generalized type anxiety. She has no hx of feeling suicidal, hopeless or having manic/hypomanic episodes. There is no hx of psychosis. She does not abuse alcohol or street drugs. Because of mood decline she entered IOP at [facility] on  06/15/19 and completed program on 07/01/19. She will start individual counseling with Hilbert OdorBethany Morris LCSW and medication management visits with me. While in IOP she was started on sertraline 25 mg which was then increased to 50 mg daily. Her depression and anxiety are in full remission. Sleep and appetite are god. She was prescribed hydroxyzine prn anxiety which she no longer needs. She is currently on FMLA and feels ready to return to work this Monday. . Visit Diagnosis:    ICD-10-CM   1. Major depressive disorder, recurrent episode, in full remission (HCC)  F33.42   2. 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  Past Medical History:  Past Medical History:  Diagnosis Date  . Anxiety   . Depression   . Gestational diabetes   . Gestational diabetes mellitus 08/23/2015  . Gestational diabetes mellitus, antepartum   . Hx of varicella   . Hyperprolactinemia  (HCC)   . Infertility, female   . Medical history non-contributory   . PCOS (polycystic ovarian syndrome)   . Postpartum care following cesarean delivery (9/24) 09/23/2015    Past Surgical History:  Procedure Laterality Date  . CESAREAN SECTION N/A 09/23/2015   Procedure: CESAREAN SECTION;  Surgeon: Maxie BetterSheronette Cousins, MD;  Location: [facility];  Service: Obstetrics;  Laterality: N/A;  . HERNIA REPAIR      Family Psychiatric History: Reviewed.  Family History:  Family History  Problem Relation Age of Onset  . Diabetes Mother   . Cancer Mother   . Heart disease Mother   . Depression Mother   . Diabetes Father   . Endometriosis Sister   . Depression Sister   . Hypertension Maternal Grandmother     Social History:  Social History   Socioeconomic History  . Marital status: Married    Spouse name: Not on file  . Number of children: Not on file  . Years of education: Not on file  . Highest education level: Not on file  Occupational History  . Not on file  Social Needs  . Financial resource strain: Not on file  . Food insecurity    Worry: Not on file    Inability: Not on file  . Transportation needs    Medical: Not on file    Non-medical: Not on file  Tobacco Use  . Smoking status: Never Smoker  . Smokeless tobacco: Never Used  Substance and Sexual Activity  . Alcohol use: No  . Drug use: No  .  Sexual activity: Yes  Lifestyle  . Physical activity    Days per week: Not on file    Minutes per session: Not on file  . Stress: Not on file  Relationships  . Social Musicianconnections    Talks on phone: Not on file    Gets together: Not on file    Attends religious service: Not on file    Active member of club or organization: Not on file    Attends meetings of clubs or organizations: Not on file    Relationship status: Not on file  Other Topics Concern  . Not on file  Social History Narrative  . Not on file    Allergies: No Known Allergies  Metabolic Disorder Labs: No  results found for: HGBA1C, MPG No results found for: PROLACTIN No results found for: CHOL, TRIG, HDL, CHOLHDL, VLDL, LDLCALC No results found for: TSH  Therapeutic Level Labs: No results found for: LITHIUM No results found for: VALPROATE No components found for:  CBMZ  Current Medications: Current Outpatient Medications  Medication Sig Dispense Refill  . meclizine (ANTIVERT) 12.5 MG tablet Take 1 tablet (12.5 mg total) by mouth 3 (three) times daily as needed for dizziness. 30 tablet 0  . sertraline (ZOLOFT) 50 MG tablet Take 1 tablet (50 mg total) by mouth daily. 30 tablet 0  . sodium chloride (OCEAN) 0.65 % SOLN nasal spray Place 1 spray into both nostrils as needed for congestion. 60 mL 0  . triamcinolone (NASACORT) 55 MCG/ACT AERO nasal inhaler Place 2 sprays into the nose daily. 1 Inhaler 12   No current facility-administered medications for this visit.     Psychiatric Specialty Exam: Review of Systems  All other systems reviewed and are negative.   unknown if currently breastfeeding.There is no height or weight on file to calculate BMI.  General Appearance: NA  Eye Contact:  NA  Speech:  Clear and Coherent and Normal Rate  Volume:  Normal  Mood:  Euthymic  Affect:  NA  Thought Process:  Goal Directed  Orientation:  Full (Time, Place, and Person)  Thought Content: Logical   Suicidal Thoughts:  No  Homicidal Thoughts:  No  Memory:  Immediate;   Good Recent;   Good Remote;   Good  Judgement:  Good  Insight:  Good  Psychomotor Activity:  NA  Concentration:  Concentration: Good and Attention Span: Good  Recall:  Good  Fund of Knowledge: Good  Language: Good  Akathisia:  Negative  Handed:  Right  AIMS (if indicated): not done  Assets:  Communication Skills Desire for Improvement Financial Resources/Insurance Housing Intimacy Physical Health Social Support Talents/Skills  ADL's:  Intact  Cognition: WNL  Sleep:  Good   Screenings: PHQ2-9     Nutrition  from 07/12/2015 in [facility]  PHQ-2 Total Score  0       Assessment and Plan: 43 yo married AA female who has experience increase in anxiety and recurrence of depression within past two months. She has a hx of post-partum depression but has never been treated for it. 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 She has panic attacks as well as generalized type anxiety. She has no hx of feeling suicidal, hopeless or having manic/hypomanic episodes. There is no hx of psychosis. She does not abuse alcohol or street drugs. Because of mood decline she entered IOP at [facility] on  06/15/19  and completed program on 07/01/19. She will start individual counseling with Lise Auer LCSW and medication management visits with me. While in IOP she was started on sertraline 25 mg which was then increased to 50 mg daily. Her depression and anxiety are in full remission. Sleep and appetite are god. She was prescribed hydroxyzine prn anxiety which she no longer needs. She is currently on FMLA and feels ready to return to work this Monday.              Plan: Continue sertraline 50 mg daily. Next appointment in early November. If depression/anxiety remain in remission we could consider discontinuing sertraline in early Spring? The plan was discussed with patient who had an opportunity to ask questions and these were all answered. I spend 25 minutes in videoconference consultation with the patient    Stephanie Acre, MD 07/30/2019, 10:08 AM

## 2019-08-04 ENCOUNTER — Ambulatory Visit (INDEPENDENT_AMBULATORY_CARE_PROVIDER_SITE_OTHER): Payer: 59 | Admitting: Psychiatry

## 2019-08-04 ENCOUNTER — Encounter (HOSPITAL_COMMUNITY): Payer: Self-pay | Admitting: Psychiatry

## 2019-08-04 ENCOUNTER — Other Ambulatory Visit: Payer: Self-pay

## 2019-08-04 DIAGNOSIS — F413 Other mixed anxiety disorders: Secondary | ICD-10-CM

## 2019-08-04 DIAGNOSIS — F3342 Major depressive disorder, recurrent, in full remission: Secondary | ICD-10-CM | POA: Diagnosis not present

## 2019-08-04 NOTE — Progress Notes (Signed)
Virtual Visit via Video Note  I connected with Lester R Scott on 08/04/19 at  4:00 PM EDT by a video enabled telemedicine application and verified that I am speaking with the correct person using two identifiers.  Location: Patient: Katherine Hood Provider: Lise Auer, LCSW   I discussed the limitations of evaluation and management by telemedicine and the availability of in person appointments. The patient expressed understanding and agreed to proceed.  History of Present Illness: MDD and other anxiety disorder   Observations/Objective: Counselor met with Shawntae for individual therapy via Webex. Counselor assessed MH symptoms and progress on treatment plan goals. Krisa denied suicidal ideation or self-harm behaviors. Athene shared numerous positive reports about her improved behaviors, thinking patterns, use of coping skills, developing interests and relationships. Counselor praised Chinyere for the progress she has made and for the time and energy she has put into healing. Kayanna reported 2 panic attacks since our last session. Counselor explored the circumstances, coping strategies used and her recovery time. Jakyia reported that her husband knew exactly how to help her and that she felt supported and cared for. Counselor explore work-related stressors. Meredith reported being better able to be successful on the job, sharing that she has changed her mindset about her role and expectations. Counselor celebrated all her success and encouraged her in the healthy changes.    Assessment and Plan: Counselor will continue to meet with patient to address treatment plan goals. Patient will continue to follow recommendations of providers and implement skills learned in session.  Follow Up Instructions: Counselor will send information for next session via Webex.     I discussed the assessment and treatment plan with the patient. The patient was provided an opportunity to ask questions and all were  answered. The patient agreed with the plan and demonstrated an understanding of the instructions.   The patient was advised to call back or seek an in-person evaluation if the symptoms worsen or if the condition fails to improve as anticipated.  I provided 55 minutes of non-face-to-face time during this encounter.   Lise Auer, LCSW

## 2019-08-07 ENCOUNTER — Other Ambulatory Visit (HOSPITAL_COMMUNITY): Payer: Self-pay | Admitting: Family

## 2019-09-30 ENCOUNTER — Ambulatory Visit (HOSPITAL_COMMUNITY): Payer: 59 | Admitting: Psychiatry

## 2019-09-30 ENCOUNTER — Encounter (HOSPITAL_COMMUNITY): Payer: Self-pay | Admitting: Psychiatry

## 2019-09-30 ENCOUNTER — Other Ambulatory Visit: Payer: Self-pay

## 2019-09-30 DIAGNOSIS — F413 Other mixed anxiety disorders: Secondary | ICD-10-CM

## 2019-09-30 NOTE — Progress Notes (Signed)
Virtual Visit via Video Note  I connected with Priscila R Jeremiah on 09/30/19 at  4:00 PM EDT by a video enabled telemedicine application and verified that I am speaking with the correct person using two identifiers.  Location: Patient: [name] Provider: Lise Auer, LCSW   I discussed the limitations of evaluation and management by telemedicine and the availability of in person appointments. The patient expressed understanding and agreed to proceed.  History of Present Illness: Anxiety Disorder   Observations/Objective: Counselor met with Marvis for individual therapy via Webex. Counselor assessed MH symptoms and progress on treatment plan goals. Soley denied suicidal ideation or self-harm behaviors. Baila shared that she has experienced 2 anxiety attacks since our last session, identifying that they were related to her husbands reaction to her mother-in-laws cancer diagnosis. Counselor processed Avri's coping strategies in dealing with the anxiety attacks. Alfreida explained that she was proud of how she was able to recover and address the anxiety, using coping skills discussed in her IOP treatment. Counselor celebrated her success. Counselor assessed work stress and satisfaction. Sanya shared that she has been intentional in actively changing her perspective and mindset at work, which has caused her to have her 2 best months in the past 19 years on the job. She stated that her positivity and expertise has been noticed and recognized by others. She stated that she uses positive affirmations, breathing techniques, communicating with supervisor, taking a break when becoming overwhelmed and has decided working from the office is the best environment for her. Counselor praised her for her application of skills and success in the workplace, highlighting progress over the past 3 months. Counselor and Deola discussed need for upcoming appointments. Cipriana would like to meet on a monthly basis  for maintenance of mental health accountability. Counselor to set appointments.   Assessment and Plan: Counselor will continue to meet with patient to address treatment plan goals. Patient will continue to follow recommendations of providers and implement skills learned in session.  Follow Up Instructions: Counselor will send information for next session via Webex.     I discussed the assessment and treatment plan with the patient. The patient was provided an opportunity to ask questions and all were answered. The patient agreed with the plan and demonstrated an understanding of the instructions.   The patient was advised to call back or seek an in-person evaluation if the symptoms worsen or if the condition fails to improve as anticipated.  I provided 60 minutes of non-face-to-face time during this encounter.   Lise Auer, LCSW

## 2019-10-05 ENCOUNTER — Ambulatory Visit (HOSPITAL_COMMUNITY): Payer: 59 | Admitting: Psychiatry

## 2019-10-05 ENCOUNTER — Other Ambulatory Visit: Payer: Self-pay

## 2019-11-02 ENCOUNTER — Other Ambulatory Visit: Payer: Self-pay

## 2019-11-02 ENCOUNTER — Ambulatory Visit (HOSPITAL_COMMUNITY): Payer: 59 | Admitting: Psychiatry

## 2019-11-08 ENCOUNTER — Ambulatory Visit (HOSPITAL_COMMUNITY): Payer: 59 | Admitting: Psychiatry

## 2019-11-08 ENCOUNTER — Other Ambulatory Visit: Payer: Self-pay

## 2019-11-09 ENCOUNTER — Ambulatory Visit (HOSPITAL_COMMUNITY): Payer: 59 | Admitting: Psychiatry

## 2020-03-23 ENCOUNTER — Ambulatory Visit: Payer: Self-pay | Attending: Family

## 2020-03-23 DIAGNOSIS — Z23 Encounter for immunization: Secondary | ICD-10-CM

## 2020-03-23 NOTE — Progress Notes (Signed)
   Covid-19 Vaccination Clinic  Name:  Katherine Hood    MRN: [id number] DOB: [birth year]  03/23/2020  [name] was observed post Covid-19 immunization for 15 minutes without incident. She was provided with Vaccine Information Sheet and instruction to access the V-Safe system.   [name] was instructed to call 911 with any severe reactions post vaccine: Marland Kitchen Difficulty breathing  . Swelling of face and throat  . A fast heartbeat  . A bad rash all over body  . Dizziness and weakness   Immunizations Administered    Name Date Dose VIS Date Route   Moderna COVID-19 Vaccine 03/23/2020  1:34 PM 0.5 mL 11/30/2019 Intramuscular   Manufacturer: Moderna   Lot: [id number]   NDC: 12393-594-09

## 2020-04-25 ENCOUNTER — Ambulatory Visit: Payer: Self-pay | Attending: Family

## 2020-04-25 DIAGNOSIS — Z23 Encounter for immunization: Secondary | ICD-10-CM

## 2020-04-25 NOTE — Progress Notes (Signed)
   Covid-19 Vaccination Clinic  Name:  Katherine Hood    MRN: [id number] DOB: February 11, 1976  04/25/2020  Katherine Hood was observed post Covid-19 immunization for 15 minutes without incident. She was provided with Vaccine Information Sheet and instruction to access the V-Safe system.   Katherine Hood was instructed to call 911 with any severe reactions post vaccine: Marland Kitchen Difficulty breathing  . Swelling of face and throat  . A fast heartbeat  . A bad rash all over body  . Dizziness and weakness   Immunizations Administered    Name Date Dose VIS Date Route   Moderna COVID-19 Vaccine 04/25/2020  1:26 PM 0.5 mL 11/2019 Intramuscular   Manufacturer: Moderna   Lot: [id number]   NDC: 88457-334-48

## 2020-08-02 ENCOUNTER — Other Ambulatory Visit: Payer: Self-pay

## 2020-08-02 ENCOUNTER — Ambulatory Visit (HOSPITAL_COMMUNITY)
Admission: RE | Admit: 2020-08-02 | Discharge: 2020-08-02 | Disposition: A | Discharge status: Home / Self Care | Payer: BC Managed Care – PPO | Source: Ambulatory Visit | Attending: Family Medicine | Admitting: Family Medicine

## 2020-08-02 ENCOUNTER — Encounter (HOSPITAL_COMMUNITY): Payer: Self-pay

## 2020-08-02 VITALS — BP 140/87 | HR 80 | Temp 99.6°F | Resp 18

## 2020-08-02 DIAGNOSIS — K219 Gastro-esophageal reflux disease without esophagitis: Secondary | ICD-10-CM | POA: Diagnosis not present

## 2020-08-02 DIAGNOSIS — Z79899 Other long term (current) drug therapy: Secondary | ICD-10-CM | POA: Diagnosis not present

## 2020-08-02 DIAGNOSIS — F419 Anxiety disorder, unspecified: Secondary | ICD-10-CM | POA: Diagnosis not present

## 2020-08-02 DIAGNOSIS — Z20822 Contact with and (suspected) exposure to covid-19: Secondary | ICD-10-CM | POA: Insufficient documentation

## 2020-08-02 DIAGNOSIS — R05 Cough: Secondary | ICD-10-CM | POA: Diagnosis present

## 2020-08-02 DIAGNOSIS — F3342 Major depressive disorder, recurrent, in full remission: Secondary | ICD-10-CM | POA: Insufficient documentation

## 2020-08-02 DIAGNOSIS — B349 Viral infection, unspecified: Secondary | ICD-10-CM | POA: Diagnosis not present

## 2020-08-02 LAB — SARS CORONAVIRUS 2 (TAT 6-24 HRS): SARS Coronavirus 2: NEGATIVE

## 2020-08-02 MED ORDER — FLUTICASONE PROPIONATE 50 MCG/ACT NA SUSP: 1.0000 | Freq: Every day | NASAL | 2 refills | Status: DC

## 2020-08-02 MED ORDER — OMEPRAZOLE 20 MG PO CPDR: 20.0000 mg | DELAYED_RELEASE_CAPSULE | Freq: Every day | ORAL | 1 refills | Status: DC

## 2020-08-02 NOTE — Discharge Instructions (Addendum)
I believe your symptoms are associated with acid reflux.  We are going to try omeprazole 20 mg once daily and Pepcid as needed at bedtime.  Make sure that you take the omeprazole 30- 60 minutes prior to a meal with a glass of water.  Avoid spicy, greasy foods, caffeine, chocolate and milk products.  No eating 2-3 hours before bedtime. Elevate the head of the bed 30 degrees.  Try this for a few weeks to see if this improves your symptoms.  If you don't see any improvement or your symptoms worsen please follow up with a GI   Flonase nasal spray and continue the allergy medicine OTC medicine as needed  Covid test pending we will call if positive

## 2020-08-02 NOTE — ED Triage Notes (Signed)
Pt presents with nasal congestion and cough that started last night. Cough is nonproductive.  Pt c/o of acid reflux xs 1 week. Taken multiple OTC medication with minimal relief. States it wake her up at night. "Sleeping sitting up seems to help."

## 2020-08-02 NOTE — ED Provider Notes (Signed)
[facility]    CSN: [id number] Arrival date & time: 08/02/20  0857      History   Chief Complaint Chief Complaint  Patient presents with  . Cough    HPI Katherine Hood is a 44 y.o. female.   Patient is a 44 year old female who presents today for 1 day of cough, nasal congestion. Cough is dry. Has not had any fever, body aches, chills, loss of taste or smell. No recent sick contacts.  She is also had increased acid reflux for the past week. She has been taking Pepcid AC, Tums with minimal relief. She also has been laying with the bed elevated sleeping at night. This seems to help. Feels like the acid is coming up into her throat. No abdominal pain, nausea or vomiting.  ROS per HPI      Past Medical History:  Diagnosis Date  . Anxiety   . Depression   . Gestational diabetes   . Gestational diabetes mellitus 08/23/2015  . Gestational diabetes mellitus, antepartum   . Hx of varicella   . Hyperprolactinemia (HCC)   . Infertility, female   . Medical history non-contributory   . PCOS (polycystic ovarian syndrome)   . Postpartum care following cesarean delivery (9/24) 09/23/2015    Patient Active Problem List   Diagnosis Date Noted  . Major depressive disorder, recurrent episode, in full remission (HCC) 07/06/2019  . Other mixed anxiety disorders 07/06/2019  . PIH (pregnancy induced hypertension) 09/24/2015  . Postpartum care following cesarean delivery (9/24) 09/23/2015  . GDM, class A2 09/22/2015    Past Surgical History:  Procedure Laterality Date  . CESAREAN SECTION N/A 09/23/2015   Procedure: CESAREAN SECTION;  Surgeon: Maxie Better, MD;  Location: [facility];  Service: Obstetrics;  Laterality: N/A;  . HERNIA REPAIR      OB History    Gravida  2   Para  1   Term  1   Preterm      AB  1   Living  0     SAB      TAB      Ectopic  1   Multiple  0   Live Births               Home Medications    Prior to Admission  medications   Medication Sig Start Date End Date Taking? Authorizing Provider  fluticasone (FLONASE) 50 MCG/ACT nasal spray Place 1 spray into both nostrils daily. 08/02/20   Dahlia Byes A, NP  meclizine (ANTIVERT) 12.5 MG tablet Take 1 tablet (12.5 mg total) by mouth 3 (three) times daily as needed for dizziness. 01/19/19   Elvina Sidle, MD  omeprazole (PRILOSEC) 20 MG capsule Take 1 capsule (20 mg total) by mouth daily. 08/02/20   Dahlia Byes A, NP  sertraline (ZOLOFT) 50 MG tablet Take 1 tablet (50 mg total) by mouth daily. 07/30/19 01/26/20  Pucilowski, Roosvelt Maser, MD  sodium chloride (OCEAN) 0.65 % SOLN nasal spray Place 1 spray into both nostrils as needed for congestion. 03/15/19   Georgetta Haber, NP  triamcinolone (NASACORT) 55 MCG/ACT AERO nasal inhaler Place 2 sprays into the nose daily. 03/15/19 08/02/20  Georgetta Haber, NP    Family History Family History  Problem Relation Age of Onset  . Diabetes Mother   . Cancer Mother   . Heart disease Mother   . Depression Mother   . Diabetes Father   . Endometriosis Sister   . Depression Sister   .  Hypertension Maternal Grandmother     Social History Social History   Tobacco Use  . Smoking status: Never Smoker  . Smokeless tobacco: Never Used  Vaping Use  . Vaping Use: Never used  Substance Use Topics  . Alcohol use: No  . Drug use: No     Allergies   Patient has no known allergies.   Review of Systems Review of Systems   Physical Exam Triage Vital Signs ED Triage Vitals  Enc Vitals Group     BP 08/02/20 0918 140/87     Pulse Rate 08/02/20 0918 80     Resp 08/02/20 0918 18     Temp 08/02/20 0918 99.6 F (37.6 C)     Temp Source 08/02/20 0918 Oral     SpO2 08/02/20 0918 99 %     Weight --      Height --      Head Circumference --      Peak Flow --      Pain Score 08/02/20 0916 0     Pain Loc --      Pain Edu? --      Excl. in GC? --    No data found.  Updated Vital Signs BP 140/87 (BP Location: Left  Arm)   Pulse 80   Temp 99.6 F (37.6 C) (Oral)   Resp 18   LMP 07/01/2020   SpO2 99%   Visual Acuity Right Eye Distance:   Left Eye Distance:   Bilateral Distance:    Right Eye Near:   Left Eye Near:    Bilateral Near:     Physical Exam Vitals and nursing note reviewed.  Constitutional:      General: She is not in acute distress.    Appearance: Normal appearance. She is not ill-appearing, toxic-appearing or diaphoretic.  HENT:     Head: Normocephalic.     Right Ear: Tympanic membrane and ear canal normal.     Left Ear: Tympanic membrane and ear canal normal.     Nose: Congestion and rhinorrhea present.     Mouth/Throat:     Pharynx: Oropharynx is clear.  Eyes:     Conjunctiva/sclera: Conjunctivae normal.  Cardiovascular:     Rate and Rhythm: Normal rate and regular rhythm.  Pulmonary:     Effort: Pulmonary effort is normal.     Breath sounds: Normal breath sounds.  Musculoskeletal:        General: Normal range of motion.     Cervical back: Normal range of motion.  Skin:    General: Skin is warm and dry.     Findings: No rash.  Neurological:     Mental Status: She is alert.  Psychiatric:        Mood and Affect: Mood normal.      UC Treatments / Results  Labs (all labs ordered are listed, but only abnormal results are displayed) Labs Reviewed  SARS CORONAVIRUS 2 (TAT 6-24 HRS)    EKG   Radiology No results found.  Procedures Procedures (including critical care time)  Medications Ordered in UC Medications - No data to display  Initial Impression / Assessment and Plan / UC Course  I have reviewed the triage vital signs and the nursing notes.  Pertinent labs & imaging results that were available during my care of the patient were reviewed by me and considered in my medical decision making (see chart for details).     Viral illness Recommended Flonase and Zyrtec. Over-the-counter meds as  needed for symptoms. Covid swab pending  GERD Starting  omeprazole daily. GI precautions and lifestyle precautions given. Pepcid at bedtime as needed. Follow-up with GI for continued symptoms. Also gave contact for primary care for follow-up. Final Clinical Impressions(s) / UC Diagnoses   Final diagnoses:  Viral illness  Gastroesophageal reflux disease without esophagitis     Discharge Instructions     I believe your symptoms are associated with acid reflux.  We are going to try omeprazole 20 mg once daily and Pepcid as needed at bedtime.  Make sure that you take the omeprazole 30- 60 minutes prior to a meal with a glass of water.  Avoid spicy, greasy foods, caffeine, chocolate and milk products.  No eating 2-3 hours before bedtime. Elevate the head of the bed 30 degrees.  Try this for a few weeks to see if this improves your symptoms.  If you don't see any improvement or your symptoms worsen please follow up with a GI   Flonase nasal spray and continue the allergy medicine OTC medicine as needed  Covid test pending we will call if positive     ED Prescriptions    Medication Sig Dispense Auth. Provider   fluticasone (FLONASE) 50 MCG/ACT nasal spray Place 1 spray into both nostrils daily. 16 g Kennis Buell A, NP   omeprazole (PRILOSEC) 20 MG capsule Take 1 capsule (20 mg total) by mouth daily. 30 capsule Velmer Broadfoot A, NP     PDMP not reviewed this encounter.   Dahlia Byes A, NP 08/02/20 1021

## 2020-10-27 ENCOUNTER — Encounter (HOSPITAL_COMMUNITY): Payer: Self-pay

## 2020-10-27 ENCOUNTER — Ambulatory Visit (HOSPITAL_COMMUNITY)
Admission: RE | Admit: 2020-10-27 | Discharge: 2020-10-27 | Disposition: A | Discharge status: Home / Self Care | Payer: BC Managed Care – PPO | Source: Ambulatory Visit | Attending: Family Medicine | Admitting: Family Medicine

## 2020-10-27 ENCOUNTER — Other Ambulatory Visit: Payer: Self-pay

## 2020-10-27 VITALS — BP 156/89 | HR 84 | Temp 98.6°F | Resp 18

## 2020-10-27 DIAGNOSIS — R0789 Other chest pain: Secondary | ICD-10-CM | POA: Diagnosis not present

## 2020-10-27 NOTE — Discharge Instructions (Signed)
 primary care and [facility]- multiple locations in the area Use anti-inflammatories for pain/swelling. You may take up to 800 mg Ibuprofen every 8 hours with food. You may supplement Ibuprofen with Tylenol [phone] mg every 4-6 hours. Alternate ice and heat to area Continue your efforts to work on diet and exercise, monitor blood pressure Follow up if not improving or worsening

## 2020-10-27 NOTE — ED Triage Notes (Signed)
Pt c/o sternal CP onset Wednesday morning 'tight" in nature 8/10 scale. Pt states she felt SOB on Wednesday, but now resolved. States she had worked out Tuesday with weights during her normal "hip hop" routine. CP increases with palpation.  Took prilosec w/o improvement of symptoms  Denies n/v, diaphoresis, dizziness, radiating pain to jaw/back/arm, belching.  EKG performed and given to Marilynn Rail, NP who advised pt can return to waiting room and be evaluated further according to time of arrival.

## 2020-10-27 NOTE — ED Provider Notes (Signed)
[facility]    CSN: [id number] Arrival date & time: 10/27/20  1800      History   Chief Complaint Chief Complaint  Patient presents with   Chest Pain    HPI Katherine Hood is a 44 y.o. female presenting today for evaluation of chest pain.  Patient reports on Wednesday she began to develop a heaviness in the left side of her chest.  She had slight associated shortness of breath.  Did feel this area appeared slightly swollen.  Worse with certain movements, improved with ibuprofen.  Does report had increase in weight lifting with her typical exercise class.  She does report previously being on hypertension medicine in her late twenties briefly as well as gestational diabetes.  Reports blood pressure and blood sugars improved with diet and exercise.  Denies tobacco use.  Denies leg pain or leg swelling.  Denies any family history of death from MI at early age.  Denies correlation to eating. Denies nausea or vomiting. Denies abdominal pain. Denies cough, recent URI or fevers.    HPI  Past Medical History:  Diagnosis Date   Anxiety    Depression    Gestational diabetes    Gestational diabetes mellitus 08/23/2015   Gestational diabetes mellitus, antepartum    Hx of varicella    Hyperprolactinemia (HCC)    Infertility, female    Medical history non-contributory    PCOS (polycystic ovarian syndrome)    Postpartum care following cesarean delivery (9/24) 09/23/2015    Patient Active Problem List   Diagnosis Date Noted   Major depressive disorder, recurrent episode, in full remission (HCC) 07/06/2019   Other mixed anxiety disorders 07/06/2019   PIH (pregnancy induced hypertension) 09/24/2015   Postpartum care following cesarean delivery (9/24) 09/23/2015   GDM, class A2 09/22/2015    Past Surgical History:  Procedure Laterality Date   CESAREAN SECTION N/A 09/23/2015   Procedure: CESAREAN SECTION;  Surgeon: Maxie Better, MD;  Location: [facility];   Service: Obstetrics;  Laterality: N/A;   HERNIA REPAIR      OB History    Gravida  2   Para  1   Term  1   Preterm      AB  1   Living  0     SAB      TAB      Ectopic  1   Multiple  0   Live Births               Home Medications    Prior to Admission medications   Medication Sig Start Date End Date Taking? Authorizing Provider  fluticasone (FLONASE) 50 MCG/ACT nasal spray Place 1 spray into both nostrils daily. 08/02/20  Yes Bast, Traci A, NP  loratadine (CLARITIN) 10 MG tablet Take 10 mg by mouth daily.   Yes [provider]  omeprazole (PRILOSEC) 20 MG capsule Take 1 capsule (20 mg total) by mouth daily. 08/02/20  Yes Bast, Traci A, NP  meclizine (ANTIVERT) 12.5 MG tablet Take 1 tablet (12.5 mg total) by mouth 3 (three) times daily as needed for dizziness. 01/19/19   Elvina Sidle, MD  sertraline (ZOLOFT) 50 MG tablet Take 1 tablet (50 mg total) by mouth daily. 07/30/19 01/26/20  Pucilowski, Roosvelt Maser, MD  sodium chloride (OCEAN) 0.65 % SOLN nasal spray Place 1 spray into both nostrils as needed for congestion. 03/15/19   Georgetta Haber, NP  triamcinolone (NASACORT) 55 MCG/ACT AERO nasal inhaler Place 2 sprays into  the nose daily. 03/15/19 08/02/20  Georgetta Haber, NP    Family History Family History  Problem Relation Age of Onset   Diabetes Mother    Cancer Mother    Heart disease Mother    Depression Mother    Diabetes Father    Endometriosis Sister    Depression Sister    Hypertension Maternal Grandmother     Social History Social History   Tobacco Use   Smoking status: Never Smoker   Smokeless tobacco: Never Used  Building services engineer Use: Never used  Substance Use Topics   Alcohol use: No   Drug use: No     Allergies   Patient has no known allergies.   Review of Systems Review of Systems  Constitutional: Negative for fatigue and fever.  HENT: Negative for congestion, sinus pressure and sore throat.   Eyes:  Negative for photophobia, pain and visual disturbance.  Respiratory: Negative for cough and shortness of breath.   Cardiovascular: Positive for chest pain.  Gastrointestinal: Negative for abdominal pain, nausea and vomiting.  Genitourinary: Negative for decreased urine volume and hematuria.  Musculoskeletal: Negative for myalgias, neck pain and neck stiffness.  Neurological: Negative for dizziness, syncope, facial asymmetry, speech difficulty, weakness, light-headedness, numbness and headaches.     Physical Exam Triage Vital Signs ED Triage Vitals  Enc Vitals Group     BP 10/27/20 1824 (!) 176/97     Pulse Rate 10/27/20 1824 84     Resp 10/27/20 1824 18     Temp 10/27/20 1824 98.6 F (37 C)     Temp Source 10/27/20 1824 Oral     SpO2 10/27/20 1824 100 %     Weight --      Height --      Head Circumference --      Peak Flow --      Pain Score 10/27/20 1829 5     Pain Loc --      Pain Edu? --      Excl. in GC? --    No data found.  Updated Vital Signs BP (!) 156/89    Pulse 84    Temp 98.6 F (37 C) (Oral)    Resp 18    LMP 10/23/2020    SpO2 100%   Visual Acuity Right Eye Distance:   Left Eye Distance:   Bilateral Distance:    Right Eye Near:   Left Eye Near:    Bilateral Near:     Physical Exam Vitals and nursing note reviewed.  Constitutional:      Appearance: She is well-developed.     Comments: No acute distress  HENT:     Head: Normocephalic and atraumatic.     Nose: Nose normal.     Mouth/Throat:     Comments: Oral mucosa pink and moist, no tonsillar enlargement or exudate. Posterior pharynx patent and nonerythematous, no uvula deviation or swelling. Normal phonation. Eyes:     Extraocular Movements: Extraocular movements intact.     Conjunctiva/sclera: Conjunctivae normal.     Pupils: Pupils are equal, round, and reactive to light.  Cardiovascular:     Rate and Rhythm: Normal rate and regular rhythm.  Pulmonary:     Effort: Pulmonary effort is  normal. No respiratory distress.     Comments: Breathing comfortably at rest, CTABL, no wheezing, rales or other adventitious sounds auscultated  Abdominal:     General: There is no distension.     Palpations: Abdomen is  soft.     Comments: nontender to palpation  Musculoskeletal:        General: Normal range of motion.     Cervical back: Neck supple.     Comments: Area of swelling noted to left upper chest towards shoulder with associated tenderness  Skin:    General: Skin is warm and dry.  Neurological:     Mental Status: She is alert and oriented to person, place, and time.      UC Treatments / Results  Labs (all labs ordered are listed, but only abnormal results are displayed) Labs Reviewed - No data to display  EKG   Radiology No results found.  Procedures Procedures (including critical care time)  Medications Ordered in UC Medications - No data to display  Initial Impression / Assessment and Plan / UC Course  I have reviewed the triage vital signs and the nursing notes.  Pertinent labs & imaging results that were available during my care of the patient were reviewed by me and considered in my medical decision making (see chart for details).     EKG normal sinus rhythm, no acute signs of ischemia or infarction, area of swelling noted over left pectoralis area with associated tenderness, suspect most likely chest wall pain, low suspicion of ACS at this time.  Recommending continuing anti-inflammatories with close monitoring.  Blood pressure rechecked, continues to be elevated, but improved from initial reading.  Recommended to continue to work on diet and exercise, recheck blood pressure in another 1 to 2 weeks, establish care with PCP if remaining elevated.  Discussed strict return precautions. Patient verbalized understanding and is agreeable with plan.  Final Clinical Impressions(s) / UC Diagnoses   Final diagnoses:  Chest wall pain     Discharge  Instructions     [facility]- multiple locations in the area Use anti-inflammatories for pain/swelling. You may take up to 800 mg Ibuprofen every 8 hours with food. You may supplement Ibuprofen with Tylenol [phone] mg every 4-6 hours. Alternate ice and heat to area Continue your efforts to work on diet and exercise, monitor blood pressure Follow up if not improving or worsening      ED Prescriptions    None     PDMP not reviewed this encounter.   Lew Dawes, [place] 10/27/20 1920

## 2021-01-24 ENCOUNTER — Other Ambulatory Visit: Payer: BC Managed Care – PPO

## 2021-01-24 DIAGNOSIS — Z20822 Contact with and (suspected) exposure to covid-19: Secondary | ICD-10-CM

## 2021-01-26 LAB — NOVEL CORONAVIRUS, NAA: SARS-CoV-2, NAA: NOT DETECTED

## 2021-01-26 LAB — SARS-COV-2, NAA 2 DAY TAT

## 2021-06-08 ENCOUNTER — Encounter (HOSPITAL_COMMUNITY): Payer: Self-pay

## 2021-06-08 ENCOUNTER — Ambulatory Visit (HOSPITAL_COMMUNITY)
Admission: RE | Admit: 2021-06-08 | Discharge: 2021-06-08 | Disposition: A | Discharge status: Home / Self Care | Payer: BC Managed Care – PPO | Source: Ambulatory Visit | Attending: Urgent Care | Admitting: Urgent Care

## 2021-06-08 ENCOUNTER — Other Ambulatory Visit: Payer: Self-pay

## 2021-06-08 VITALS — BP 143/89 | HR 84 | Temp 98.8°F | Resp 18

## 2021-06-08 DIAGNOSIS — J029 Acute pharyngitis, unspecified: Secondary | ICD-10-CM

## 2021-06-08 LAB — POCT RAPID STREP A, ED / UC: Streptococcus, Group A Screen (Direct): NEGATIVE

## 2021-06-08 MED ORDER — CETIRIZINE HCL 10 MG PO TABS: 10.0000 mg | ORAL_TABLET | Freq: Every day | ORAL | 0 refills | Status: DC

## 2021-06-08 MED ORDER — PSEUDOEPHEDRINE HCL 60 MG PO TABS: 60.0000 mg | ORAL_TABLET | Freq: Three times a day (TID) | ORAL | 0 refills | Status: DC | PRN

## 2021-06-08 NOTE — ED Provider Notes (Signed)
[facility]   MRN: [id number] DOB: [birth year]  Subjective:   Katherine Hood is a 45 y.o. female presenting for 1 day history of acute onset persistent throat pain with painful swallowing.  Painful swallowing improved today but she is also developed a runny nose.  She has a history of allergic rhinitis and uses Flonase daily.  Has a history of tonsillitis and wants to make sure its not strep.  No current facility-administered medications for this encounter.  Current Outpatient Medications:    dextromethorphan-guaiFENesin (MUCINEX DM) 30-600 MG 12hr tablet, Take 1 tablet by mouth 2 (two) times daily., Disp: , Rfl:    fluticasone (FLONASE) 50 MCG/ACT nasal spray, Place 1 spray into both nostrils daily., Disp: 16 g, Rfl: 2   loratadine (CLARITIN) 10 MG tablet, Take 10 mg by mouth daily., Disp: , Rfl:    meclizine (ANTIVERT) 12.5 MG tablet, Take 1 tablet (12.5 mg total) by mouth 3 (three) times daily as needed for dizziness., Disp: 30 tablet, Rfl: 0   omeprazole (PRILOSEC) 20 MG capsule, Take 1 capsule (20 mg total) by mouth daily., Disp: 30 capsule, Rfl: 1   sertraline (ZOLOFT) 50 MG tablet, Take 1 tablet (50 mg total) by mouth daily., Disp: 90 tablet, Rfl: 1   sodium chloride (OCEAN) 0.65 % SOLN nasal spray, Place 1 spray into both nostrils as needed for congestion., Disp: 60 mL, Rfl: 0   No Known Allergies  Past Medical History:  Diagnosis Date   Anxiety    Depression    Gestational diabetes    Gestational diabetes mellitus 08/23/2015   Gestational diabetes mellitus, antepartum    Hx of varicella    Hyperprolactinemia (HCC)    Infertility, female    Medical history non-contributory    PCOS (polycystic ovarian syndrome)    Postpartum care following cesarean delivery (9/24) 09/23/2015     Past Surgical History:  Procedure Laterality Date   CESAREAN SECTION N/A 09/23/2015   Procedure: CESAREAN SECTION;  Surgeon: Maxie Better, MD;  Location: [facility];   Service: Obstetrics;  Laterality: N/A;   HERNIA REPAIR      Family History  Problem Relation Age of Onset   Diabetes Mother    Cancer Mother    Heart disease Mother    Depression Mother    Diabetes Father    Endometriosis Sister    Depression Sister    Hypertension Maternal Grandmother     Social History   Tobacco Use   Smoking status: Never   Smokeless tobacco: Never  Vaping Use   Vaping Use: Never used  Substance Use Topics   Alcohol use: No   Drug use: No    ROS   Objective:   Vitals: BP (!) 143/89 (BP Location: Right Arm)   Pulse 84   Temp 98.8 F (37.1 C) (Oral)   Resp 18   LMP 05/28/2021 (Exact Date)   SpO2 98%   Breastfeeding No   Physical Exam Constitutional:      General: She is not in acute distress.    Appearance: Normal appearance. She is well-developed. She is not ill-appearing.  HENT:     Head: Normocephalic and atraumatic.     Nose: Nose normal.     Mouth/Throat:     Mouth: Mucous membranes are moist.     Pharynx: Posterior oropharyngeal erythema (with associated postnasal drainage overlying pharynx and cobblestone pattern) present. No oropharyngeal exudate.  Eyes:     General: No scleral icterus.  Right eye: No discharge.        Left eye: No discharge.     Extraocular Movements: Extraocular movements intact.     Conjunctiva/sclera: Conjunctivae normal.     Pupils: Pupils are equal, round, and reactive to light.  Cardiovascular:     Rate and Rhythm: Normal rate.  Pulmonary:     Effort: Pulmonary effort is normal.  Skin:    General: Skin is warm and dry.  Neurological:     General: No focal deficit present.     Mental Status: She is alert and oriented to person, place, and time.  Psychiatric:        Mood and Affect: Mood normal.        Behavior: Behavior normal.    Results for orders placed or performed during the hospital encounter of 06/08/21 (from the past 24 hour(s))  POCT Rapid Strep A     Status: None   Collection  Time: 06/08/21  7:18 PM  Result Value Ref Range   Streptococcus, Group A Screen (Direct) NEGATIVE NEGATIVE    Assessment and Plan :   PDMP not reviewed this encounter.  1. Viral pharyngitis   2. Sore throat     Strep culture pending, will manage conservatively with supportive care. Counseled patient on potential for adverse effects with medications prescribed/recommended today, ER and return-to-clinic precautions discussed, patient verbalized understanding.    Wallis Bamberg, [place] 06/08/21 1925

## 2021-06-08 NOTE — ED Triage Notes (Signed)
Pt reports sore throat x 1 day. Mucinex gives relief. Denies fever.

## 2021-06-11 LAB — CULTURE, GROUP A STREP (THRC)

## 2021-06-13 ENCOUNTER — Ambulatory Visit (HOSPITAL_COMMUNITY)
Admission: RE | Admit: 2021-06-13 | Discharge: 2021-06-13 | Disposition: A | Discharge status: Home / Self Care | Payer: BC Managed Care – PPO | Source: Ambulatory Visit | Attending: Internal Medicine | Admitting: Internal Medicine

## 2021-06-13 ENCOUNTER — Other Ambulatory Visit: Payer: Self-pay

## 2021-06-13 ENCOUNTER — Encounter (HOSPITAL_COMMUNITY): Payer: Self-pay

## 2021-06-13 VITALS — BP 126/77 | HR 85 | Temp 98.7°F | Resp 18

## 2021-06-13 DIAGNOSIS — Z1152 Encounter for screening for COVID-19: Secondary | ICD-10-CM | POA: Insufficient documentation

## 2021-06-13 DIAGNOSIS — J0120 Acute ethmoidal sinusitis, unspecified: Secondary | ICD-10-CM | POA: Diagnosis not present

## 2021-06-13 MED ORDER — AMOXICILLIN-POT CLAVULANATE 875-125 MG PO TABS: 1.0000 | ORAL_TABLET | Freq: Two times a day (BID) | ORAL | 0 refills | Status: AC

## 2021-06-13 NOTE — ED Provider Notes (Signed)
[facility]    CSN: [id number] Arrival date & time: 06/13/21  1743      History   Chief Complaint Chief Complaint  Patient presents with   Appointment    6pm   sinus pressure   Nasal Congestion    HPI Katherine Hood is a 45 y.o. female presents to urgent care today with complaints of persistent nasal congestion.  Patient initially seen at clinic on 6/10 and treated conservatively for URI.  Since that visit patient states sore throat has subsided however now with right-sided facial pain and persistent nasal congestion.  Patient continues to use cetirizine, Tylenol cold and flu and sinus rinse.  She denies any recent fever or chills, headache, sore throat, chest pain, SOB, abdominal pain, N/V/D.  Patient endorses cough worse in a.m. and improving throughout the day.    Past Medical History:  Diagnosis Date   Anxiety    Depression    Gestational diabetes    Gestational diabetes mellitus 08/23/2015   Gestational diabetes mellitus, antepartum    Hx of varicella    Hyperprolactinemia (HCC)    Infertility, female    Medical history non-contributory    PCOS (polycystic ovarian syndrome)    Postpartum care following cesarean delivery (9/24) 09/23/2015    Patient Active Problem List   Diagnosis Date Noted   Major depressive disorder, recurrent episode, in full remission (HCC) 07/06/2019   Other mixed anxiety disorders 07/06/2019   PIH (pregnancy induced hypertension) 09/24/2015   Postpartum care following cesarean delivery (9/24) 09/23/2015   GDM, class A2 09/22/2015    Past Surgical History:  Procedure Laterality Date   CESAREAN SECTION N/A 09/23/2015   Procedure: CESAREAN SECTION;  Surgeon: Maxie Better, MD;  Location: [facility];  Service: Obstetrics;  Laterality: N/A;   HERNIA REPAIR      OB History     Gravida  2   Para  1   Term  1   Preterm      AB  1   Living  0      SAB      IAB      Ectopic  1   Multiple  0   Live Births                Home Medications    Prior to Admission medications   Medication Sig Start Date End Date Taking? Authorizing Provider  amoxicillin-clavulanate (AUGMENTIN) 875-125 MG tablet Take 1 tablet by mouth 2 (two) times daily for 7 days. 06/13/21 06/20/21 Yes Rolla Etienne, NP  cetirizine (ZYRTEC ALLERGY) 10 MG tablet Take 1 tablet (10 mg total) by mouth daily. 06/08/21   Wallis Bamberg, PA-C  dextromethorphan-guaiFENesin [facility] DM) 30-600 MG 12hr tablet Take 1 tablet by mouth 2 (two) times daily.    [provider]  fluticasone (FLONASE) 50 MCG/ACT nasal spray Place 1 spray into both nostrils daily. 08/02/20   Dahlia Byes A, NP  loratadine (CLARITIN) 10 MG tablet Take 10 mg by mouth daily.    [provider]  meclizine (ANTIVERT) 12.5 MG tablet Take 1 tablet (12.5 mg total) by mouth 3 (three) times daily as needed for dizziness. 01/19/19   Elvina Sidle, MD  omeprazole (PRILOSEC) 20 MG capsule Take 1 capsule (20 mg total) by mouth daily. 08/02/20   Dahlia Byes A, NP  pseudoephedrine (SUDAFED) 60 MG tablet Take 1 tablet (60 mg total) by mouth every 8 (eight) hours as needed for congestion. 06/08/21   Wallis Bamberg,  PA-C  sertraline (ZOLOFT) 50 MG tablet Take 1 tablet (50 mg total) by mouth daily. 07/30/19 01/26/20  Pucilowski, Roosvelt Maser, MD  sodium chloride (OCEAN) 0.65 % SOLN nasal spray Place 1 spray into both nostrils as needed for congestion. 03/15/19   Georgetta Haber, NP  triamcinolone (NASACORT) 55 MCG/ACT AERO nasal inhaler Place 2 sprays into the nose daily. 03/15/19 08/02/20  Georgetta Haber, NP    Family History Family History  Problem Relation Age of Onset   Diabetes Mother    Cancer Mother    Heart disease Mother    Depression Mother    Diabetes Father    Endometriosis Sister    Depression Sister    Hypertension Maternal Grandmother     Social History Social History   Tobacco Use   Smoking status: Never   Smokeless tobacco: Never  Vaping Use   Vaping  Use: Never used  Substance Use Topics   Alcohol use: No   Drug use: No     Allergies   Patient has no known allergies.   Review of Systems Stated in HPI otherwise negative   Physical Exam Triage Vital Signs ED Triage Vitals  Enc Vitals Group     BP 06/13/21 1759 126/77     Pulse Rate 06/13/21 1758 85     Resp 06/13/21 1758 18     Temp 06/13/21 1759 98.7 F (37.1 C)     Temp Source 06/13/21 1758 Oral     SpO2 06/13/21 1758 100 %     Weight --      Height --      Head Circumference --      Peak Flow --      Pain Score 06/13/21 1755 3     Pain Loc --      Pain Edu? --      Excl. in GC? --    No data found.  Updated Vital Signs BP 126/77 (BP Location: Right Arm)   Pulse 85   Temp 98.7 F (37.1 C) (Oral)   Resp 18   LMP 05/28/2021 (Exact Date)   SpO2 100%   Visual Acuity Right Eye Distance:   Left Eye Distance:   Bilateral Distance:    Right Eye Near:   Left Eye Near:    Bilateral Near:     Physical Exam Constitutional:      General: She is not in acute distress.    Appearance: Normal appearance. She is not ill-appearing or toxic-appearing.  HENT:     Ears:     Comments: Bilateral middle ear effusion, no erythema, no perforation    Nose: Congestion present.     Mouth/Throat:     Mouth: Mucous membranes are moist.     Comments: Posterior pharyngeal erythema with continued cobblestone appearance, 1+ tonsillar swelling without exudate,+ PND Eyes:     General: No scleral icterus.    Extraocular Movements: Extraocular movements intact.     Conjunctiva/sclera: Conjunctivae normal.  Cardiovascular:     Rate and Rhythm: Normal rate and regular rhythm.  Pulmonary:     Effort: Pulmonary effort is normal.     Breath sounds: Normal breath sounds. No wheezing, rhonchi or rales.  Abdominal:     General: Bowel sounds are normal.     Palpations: Abdomen is soft.  Musculoskeletal:        General: Normal range of motion.     Cervical back: Normal range of  motion and neck supple. No tenderness.  Lymphadenopathy:     Cervical: No cervical adenopathy.  Skin:    General: Skin is warm and dry.  Neurological:     General: No focal deficit present.     Mental Status: She is alert and oriented to person, place, and time.  Psychiatric:        Mood and Affect: Mood normal.        Behavior: Behavior normal.     UC Treatments / Results  Labs (all labs ordered are listed, but only abnormal results are displayed) Labs Reviewed  SARS CORONAVIRUS 2 (TAT 6-24 HRS)    EKG   Radiology No results found.  Procedures Procedures (including critical care time)  Medications Ordered in UC Medications - No data to display  Initial Impression / Assessment and Plan / UC Course  I have reviewed the triage vital signs and the nursing notes.  Pertinent labs & imaging results that were available during my care of the patient were reviewed by me and considered in my medical decision making (see chart for details).  Acute sinusitis, ethmoid -Persistent x1 week with little to no improvement.  Discomfort upon palpation of right ethmoid sinus.  Patient afebrile and nontoxic-appearing -Augmentin twice daily x7 days, continue cetirizine, Flonase and nasal rinse -COVID PCR sent at patient request.  No indication for further isolation as patient is greater than 5 days from symptom onset -Follow-up precautions discussed  Reviewed expections re: course of current medical issues. Questions answered. Outlined signs and symptoms indicating need for more acute intervention. Pt verbalized understanding. AVS given  Final Clinical Impressions(s) / UC Diagnoses   Final diagnoses:  Acute non-recurrent ethmoidal sinusitis  Encounter for screening for COVID-19     Discharge Instructions      Take antibiotic as prescribed.  Push fluids.  Continue cetirizine and Flonase.  Your COVID testing has been sent however you are outside of isolation window so no need to  isolate if you remain fever free.  Those results should be available on your MyChart in the next 12 to 24 hours.  Please return for any worsening or persistent symptoms     ED Prescriptions     Medication Sig Dispense Auth. Provider   amoxicillin-clavulanate (AUGMENTIN) 875-125 MG tablet Take 1 tablet by mouth 2 (two) times daily for 7 days. 14 tablet Rolla Etienne, NP      PDMP not reviewed this encounter.   Rolla Etienne, NP 06/13/21 [phone]

## 2021-06-13 NOTE — Discharge Instructions (Addendum)
Take antibiotic as prescribed.  Push fluids.  Continue cetirizine and Flonase.  Your COVID testing has been sent however you are outside of isolation window so no need to isolate if you remain fever free.  Those results should be available on your MyChart in the next 12 to 24 hours.  Please return for any worsening or persistent symptoms

## 2021-06-13 NOTE — ED Triage Notes (Signed)
Pt c/o sinus pressure and congestion X 4 days.

## 2021-06-14 LAB — SARS CORONAVIRUS 2 (TAT 6-24 HRS): SARS Coronavirus 2: NEGATIVE

## 2021-06-17 ENCOUNTER — Encounter (HOSPITAL_COMMUNITY): Payer: Self-pay | Admitting: Emergency Medicine

## 2021-06-17 ENCOUNTER — Emergency Department (HOSPITAL_COMMUNITY)
Admission: EM | Admit: 2021-06-17 | Discharge: 2021-06-17 | Disposition: A | Discharge status: Home / Self Care | Payer: BC Managed Care – PPO | Attending: Emergency Medicine | Admitting: Emergency Medicine

## 2021-06-17 ENCOUNTER — Other Ambulatory Visit: Payer: Self-pay

## 2021-06-17 DIAGNOSIS — M62838 Other muscle spasm: Secondary | ICD-10-CM | POA: Insufficient documentation

## 2021-06-17 DIAGNOSIS — M542 Cervicalgia: Secondary | ICD-10-CM | POA: Diagnosis present

## 2021-06-17 MED ORDER — METHOCARBAMOL 500 MG PO TABS: 500.0000 mg | ORAL_TABLET | Freq: Two times a day (BID) | ORAL | 0 refills | Status: DC | PRN

## 2021-06-17 MED ORDER — IBUPROFEN 600 MG PO TABS: 600.0000 mg | ORAL_TABLET | Freq: Four times a day (QID) | ORAL | 0 refills | Status: DC | PRN

## 2021-06-17 NOTE — ED Triage Notes (Addendum)
Pt here for L side neck pain that goes down into L shoulder for a few days. Woke up w/ pain but getting worse. Pt tried tylenol, ibuprofen, a muscle relax, all w/o relief.

## 2021-06-17 NOTE — ED Provider Notes (Signed)
[facility] EMERGENCY DEPARTMENT Provider Note   CSN: [id number] Arrival date & time: 06/17/21  1327     History Chief Complaint  Patient presents with   Neck Pain    Katherine Hood is a 45 y.o. female.   Neck Pain Associated symptoms: no fever, no numbness and no weakness    This patient is a very pleasant 46 year old female, she has a history of anxiety and depression, PCOS, she is not on any significant daily medications.  She presents to the hospital today with a complaint of left-sided neck pain which has been present since Wednesday.  Over the last 4 days this pain has been persistent, seems to get worse when she moves her head or neck in certain positions.  It is not associated with any pain or numbness or weakness down her left arm, the pain terminates at the top of the shoulder.  It is not located on the right side and is not associated with chest pain shortness of breath fevers or chills.  She has been taking an anti-inflammatory and applying cold compresses, last night her sister gave her one of her muscle relaxers and this did help her to fall asleep.  The pain continues today.  She does not recall an injury.  No prior history of neck pain.  She does get charley horses in her legs which she needs to occasionally stretch out.  Past Medical History:  Diagnosis Date   Anxiety    Depression    Gestational diabetes    Gestational diabetes mellitus 08/23/2015   Gestational diabetes mellitus, antepartum    Hx of varicella    Hyperprolactinemia (HCC)    Infertility, female    Medical history non-contributory    PCOS (polycystic ovarian syndrome)    Postpartum care following cesarean delivery (9/24) 09/23/2015    Patient Active Problem List   Diagnosis Date Noted   Major depressive disorder, recurrent episode, in full remission (HCC) 07/06/2019   Other mixed anxiety disorders 07/06/2019   PIH (pregnancy induced hypertension) 09/24/2015   Postpartum care  following cesarean delivery (9/24) 09/23/2015   GDM, class A2 09/22/2015    Past Surgical History:  Procedure Laterality Date   CESAREAN SECTION N/A 09/23/2015   Procedure: CESAREAN SECTION;  Surgeon: Maxie Better, MD;  Location: [facility];  Service: Obstetrics;  Laterality: N/A;   HERNIA REPAIR       OB History     Gravida  2   Para  1   Term  1   Preterm      AB  1   Living  0      SAB      IAB      Ectopic  1   Multiple  0   Live Births              Family History  Problem Relation Age of Onset   Diabetes Mother    Cancer Mother    Heart disease Mother    Depression Mother    Diabetes Father    Endometriosis Sister    Depression Sister    Hypertension Maternal Grandmother     Social History   Tobacco Use   Smoking status: Never   Smokeless tobacco: Never  Vaping Use   Vaping Use: Never used  Substance Use Topics   Alcohol use: No   Drug use: No    Home Medications Prior to Admission medications   Medication Sig Start Date End Date  Taking? Authorizing Provider  ibuprofen (ADVIL) 600 MG tablet Take 1 tablet (600 mg total) by mouth every 6 (six) hours as needed. 06/17/21  Yes Eber Hong, MD  methocarbamol (ROBAXIN) 500 MG tablet Take 1 tablet (500 mg total) by mouth 2 (two) times daily as needed for muscle spasms. 06/17/21  Yes Eber Hong, MD  amoxicillin-clavulanate (AUGMENTIN) 875-125 MG tablet Take 1 tablet by mouth 2 (two) times daily for 7 days. 06/13/21 06/20/21  Rolla Etienne, NP  cetirizine (ZYRTEC ALLERGY) 10 MG tablet Take 1 tablet (10 mg total) by mouth daily. 06/08/21   Wallis Bamberg, PA-C  dextromethorphan-guaiFENesin [facility] DM) 30-600 MG 12hr tablet Take 1 tablet by mouth 2 (two) times daily.    [provider]  fluticasone (FLONASE) 50 MCG/ACT nasal spray Place 1 spray into both nostrils daily. 08/02/20   Dahlia Byes A, NP  loratadine (CLARITIN) 10 MG tablet Take 10 mg by mouth daily.    [provider]   meclizine (ANTIVERT) 12.5 MG tablet Take 1 tablet (12.5 mg total) by mouth 3 (three) times daily as needed for dizziness. 01/19/19   Elvina Sidle, MD  omeprazole (PRILOSEC) 20 MG capsule Take 1 capsule (20 mg total) by mouth daily. 08/02/20   Dahlia Byes A, NP  pseudoephedrine (SUDAFED) 60 MG tablet Take 1 tablet (60 mg total) by mouth every 8 (eight) hours as needed for congestion. 06/08/21   Wallis Bamberg, PA-C  sertraline (ZOLOFT) 50 MG tablet Take 1 tablet (50 mg total) by mouth daily. 07/30/19 01/26/20  Pucilowski, Roosvelt Maser, MD  sodium chloride (OCEAN) 0.65 % SOLN nasal spray Place 1 spray into both nostrils as needed for congestion. 03/15/19   Georgetta Haber, NP  triamcinolone (NASACORT) 55 MCG/ACT AERO nasal inhaler Place 2 sprays into the nose daily. 03/15/19 08/02/20  Georgetta Haber, NP    Allergies    Patient has no known allergies.  Review of Systems   Review of Systems  Constitutional:  Negative for fever.  Musculoskeletal:  Positive for neck pain.  Neurological:  Negative for weakness and numbness.   Physical Exam Updated Vital Signs BP (!) 160/86 (BP Location: Right Arm)   Pulse 79   Temp 98.6 F (37 C)   Resp 16   Ht 1.727 m (5\' 8" )   Wt 120.2 kg   LMP 05/28/2021 (Exact Date)   SpO2 99%   BMI 40.29 kg/m   Physical Exam Vitals and nursing note reviewed.  Constitutional:      Appearance: She is well-developed. She is not diaphoretic.  HENT:     Head: Normocephalic and atraumatic.  Eyes:     General:        Right eye: No discharge.        Left eye: No discharge.     Conjunctiva/sclera: Conjunctivae normal.  Pulmonary:     Effort: Pulmonary effort is normal. No respiratory distress.  Musculoskeletal:        General: Tenderness present.     Right lower leg: No edema.     Left lower leg: No edema.     Comments: Tender to palpation of the left trapezius muscle, there is some muscle spasm seen, increased muscle tension, there is totally normal range of motion of  all 4 extremities at the major joints.  She can fully range her shoulder to internal/external rotation as well as flexion and extension abduction and abduction.  Skin:    General: Skin is warm and dry.  Findings: No erythema or rash.  Neurological:     Mental Status: She is alert.     Coordination: Coordination normal.     Comments: Normal strength and sensation of the bilateral upper extremities    ED Results / Procedures / Treatments   Labs (all labs ordered are listed, but only abnormal results are displayed) Labs Reviewed - No data to display  EKG None  Radiology No results found.  Procedures Procedures   Medications Ordered in ED Medications - No data to display  ED Course  I have reviewed the triage vital signs and the nursing notes.  Pertinent labs & imaging results that were available during my care of the patient were reviewed by me and considered in my medical decision making (see chart for details).    MDM Rules/Calculators/A&P                          Well-appearing, muscle spasm, seems more consistent with a torticollis type picture.  Will prescribe anti-inflammatory, short course of muscle relaxants, encouraged warm compresses, stretching and follow-up with PCP, patient agreeable  Final Clinical Impression(s) / ED Diagnoses Final diagnoses:  Muscle spasms of neck    Rx / DC Orders ED Discharge Orders          Ordered    ibuprofen (ADVIL) 600 MG tablet  Every 6 hours PRN        06/17/21 1416    methocarbamol (ROBAXIN) 500 MG tablet  2 times daily PRN        06/17/21 1416             Eber Hong, MD 06/17/21 1417

## 2021-06-17 NOTE — Discharge Instructions (Addendum)
Ibuprofen 3-4 times daily Robaxin as prescribed See your doctor in 1 week if no better ER for numbness / weakenss / severe pain Stretch and use warm compresses intemrittently.

## 2021-09-21 ENCOUNTER — Ambulatory Visit: Payer: BC Managed Care – PPO | Admitting: Physician Assistant

## 2021-10-22 ENCOUNTER — Encounter (HOSPITAL_COMMUNITY): Payer: Self-pay

## 2021-10-22 ENCOUNTER — Ambulatory Visit (HOSPITAL_COMMUNITY)
Admission: RE | Admit: 2021-10-22 | Discharge: 2021-10-22 | Disposition: A | Discharge status: Home / Self Care | Payer: BC Managed Care – PPO | Source: Ambulatory Visit | Attending: Internal Medicine | Admitting: Internal Medicine

## 2021-10-22 ENCOUNTER — Other Ambulatory Visit: Payer: Self-pay

## 2021-10-22 VITALS — BP 155/86 | HR 78 | Temp 98.2°F | Resp 16

## 2021-10-22 DIAGNOSIS — N390 Urinary tract infection, site not specified: Secondary | ICD-10-CM | POA: Insufficient documentation

## 2021-10-22 LAB — POCT URINALYSIS DIPSTICK, ED / UC
Bilirubin Urine: NEGATIVE
Glucose, UA: NEGATIVE mg/dL
Hgb urine dipstick: NEGATIVE
Ketones, ur: NEGATIVE mg/dL
Nitrite: NEGATIVE
Protein, ur: NEGATIVE mg/dL
Specific Gravity, Urine: 1.02 (ref 1.005–1.030)
Urobilinogen, UA: 0.2 mg/dL (ref 0.0–1.0)
pH: 7.5 (ref 5.0–8.0)

## 2021-10-22 LAB — POC URINE PREG, ED: Preg Test, Ur: NEGATIVE

## 2021-10-22 MED ORDER — NITROFURANTOIN MONOHYD MACRO 100 MG PO CAPS: 100.0000 mg | ORAL_CAPSULE | Freq: Two times a day (BID) | ORAL | 0 refills | Status: DC

## 2021-10-22 MED ORDER — FLUCONAZOLE 150 MG PO TABS: ORAL_TABLET | ORAL | 0 refills | Status: DC

## 2021-10-22 NOTE — ED Provider Notes (Signed)
[facility]    CSN: [id number] Arrival date & time: 10/22/21  1756      History   Chief Complaint Chief Complaint  Patient presents with   Urinary Frequency    APPT   Abdominal Pain    HPI Katherine Hood is a 45 y.o. female.   Patient here for evaluation of urinary frequency and lower abdominal pain that has been ongoing for the past 4 days.  Also reports having some dysuria.  Reports having similar symptoms with UTIs in the past.  Reports taking AZO with some symptom relief.  Patient does report getting frequent yeast infections with antibiotic use.  Denies any trauma, injury, or other precipitating event.  Denies any specific alleviating or aggravating factors.  Denies any fevers, chest pain, shortness of breath, N/V/D, numbness, tingling, weakness, or headaches.    The history is provided by the patient.  Urinary Frequency Associated symptoms include abdominal pain.  Abdominal Pain  Past Medical History:  Diagnosis Date   Anxiety    Depression    Gestational diabetes    Gestational diabetes mellitus 08/23/2015   Gestational diabetes mellitus, antepartum    Hx of varicella    Hyperprolactinemia (HCC)    Infertility, female    Medical history non-contributory    PCOS (polycystic ovarian syndrome)    Postpartum care following cesarean delivery (9/24) 09/23/2015    Patient Active Problem List   Diagnosis Date Noted   Major depressive disorder, recurrent episode, in full remission (HCC) 07/06/2019   Other mixed anxiety disorders 07/06/2019   PIH (pregnancy induced hypertension) 09/24/2015   Postpartum care following cesarean delivery (9/24) 09/23/2015   GDM, class A2 09/22/2015    Past Surgical History:  Procedure Laterality Date   CESAREAN SECTION N/A 09/23/2015   Procedure: CESAREAN SECTION;  Surgeon: Maxie Better, MD;  Location: [facility];  Service: Obstetrics;  Laterality: N/A;   HERNIA REPAIR      OB History     Gravida  2   Para  1    Term  1   Preterm      AB  1   Living  0      SAB      IAB      Ectopic  1   Multiple  0   Live Births               Home Medications    Prior to Admission medications   Medication Sig Start Date End Date Taking? Authorizing Provider  fluconazole (DIFLUCAN) 150 MG tablet Take one pill today.  Take the second pill in 3 days if you are are still having symptoms. 10/22/21  Yes Ivette Loyal, NP  nitrofurantoin, macrocrystal-monohydrate, (MACROBID) 100 MG capsule Take 1 capsule (100 mg total) by mouth 2 (two) times daily. 10/22/21  Yes Ivette Loyal, NP  cetirizine (ZYRTEC ALLERGY) 10 MG tablet Take 1 tablet (10 mg total) by mouth daily. 06/08/21   Wallis Bamberg, PA-C  dextromethorphan-guaiFENesin [facility] DM) 30-600 MG 12hr tablet Take 1 tablet by mouth 2 (two) times daily.    [provider]  fluticasone (FLONASE) 50 MCG/ACT nasal spray Place 1 spray into both nostrils daily. 08/02/20   Dahlia Byes A, NP  ibuprofen (ADVIL) 600 MG tablet Take 1 tablet (600 mg total) by mouth every 6 (six) hours as needed. 06/17/21   Eber Hong, MD  loratadine (CLARITIN) 10 MG tablet Take 10 mg by mouth daily.    [provider]  meclizine (ANTIVERT) 12.5 MG tablet Take 1 tablet (12.5 mg total) by mouth 3 (three) times daily as needed for dizziness. 01/19/19   Elvina Sidle, MD  methocarbamol (ROBAXIN) 500 MG tablet Take 1 tablet (500 mg total) by mouth 2 (two) times daily as needed for muscle spasms. 06/17/21   Eber Hong, MD  omeprazole (PRILOSEC) 20 MG capsule Take 1 capsule (20 mg total) by mouth daily. 08/02/20   Dahlia Byes A, NP  pseudoephedrine (SUDAFED) 60 MG tablet Take 1 tablet (60 mg total) by mouth every 8 (eight) hours as needed for congestion. 06/08/21   Wallis Bamberg, PA-C  sertraline (ZOLOFT) 50 MG tablet Take 1 tablet (50 mg total) by mouth daily. 07/30/19 01/26/20  Pucilowski, Roosvelt Maser, MD  sodium chloride (OCEAN) 0.65 % SOLN nasal spray Place 1 spray into  both nostrils as needed for congestion. 03/15/19   Georgetta Haber, NP  triamcinolone (NASACORT) 55 MCG/ACT AERO nasal inhaler Place 2 sprays into the nose daily. 03/15/19 08/02/20  Georgetta Haber, NP    Family History Family History  Problem Relation Age of Onset   Diabetes Mother    Cancer Mother    Heart disease Mother    Depression Mother    Diabetes Father    Endometriosis Sister    Depression Sister    Hypertension Maternal Grandmother     Social History Social History   Tobacco Use   Smoking status: Never   Smokeless tobacco: Never  Vaping Use   Vaping Use: Never used  Substance Use Topics   Alcohol use: No   Drug use: No     Allergies   Patient has no known allergies.   Review of Systems Review of Systems  Gastrointestinal:  Positive for abdominal pain.  Genitourinary:  Positive for frequency.  All other systems reviewed and are negative.   Physical Exam Triage Vital Signs ED Triage Vitals  Enc Vitals Group     BP 10/22/21 1814 (!) 155/86     Pulse Rate 10/22/21 1814 78     Resp 10/22/21 1814 16     Temp 10/22/21 1814 98.2 F (36.8 C)     Temp Source 10/22/21 1814 Oral     SpO2 10/22/21 1814 99 %     Weight --      Height --      Head Circumference --      Peak Flow --      Pain Score 10/22/21 1811 0     Pain Loc --      Pain Edu? --      Excl. in GC? --    No data found.  Updated Vital Signs BP (!) 155/86 (BP Location: Left Arm)   Pulse 78   Temp 98.2 F (36.8 C) (Oral)   Resp 16   LMP 09/24/2021   SpO2 99%   Visual Acuity Right Eye Distance:   Left Eye Distance:   Bilateral Distance:    Right Eye Near:   Left Eye Near:    Bilateral Near:     Physical Exam Vitals and nursing note reviewed.  Constitutional:      General: She is not in acute distress.    Appearance: Normal appearance. She is not ill-appearing, toxic-appearing or diaphoretic.  HENT:     Head: Normocephalic and atraumatic.  Eyes:     Conjunctiva/sclera:  Conjunctivae normal.  Cardiovascular:     Rate and Rhythm: Normal rate.     Pulses: Normal pulses.  Pulmonary:     Effort: Pulmonary effort is normal.  Abdominal:     General: Abdomen is flat.     Palpations: Abdomen is soft.     Tenderness: There is no right CVA tenderness or left CVA tenderness.  Genitourinary:    Comments: declines Musculoskeletal:        General: Normal range of motion.     Cervical back: Normal range of motion.  Skin:    General: Skin is warm and dry.  Neurological:     General: No focal deficit present.     Mental Status: She is alert and oriented to person, place, and time.  Psychiatric:        Mood and Affect: Mood normal.     UC Treatments / Results  Labs (all labs ordered are listed, but only abnormal results are displayed) Labs Reviewed  POCT URINALYSIS DIPSTICK, ED / UC - Abnormal; Notable for the following components:      Result Value   Leukocytes,Ua SMALL (*)    All other components within normal limits  URINE CULTURE  POC URINE PREG, ED    EKG   Radiology No results found.  Procedures Procedures (including critical care time)  Medications Ordered in UC Medications - No data to display  Initial Impression / Assessment and Plan / UC Course  I have reviewed the triage vital signs and the nursing notes.  Pertinent labs & imaging results that were available during my care of the patient were reviewed by me and considered in my medical decision making (see chart for details).    Assessment negative for red flags or concerns.  Urinalysis positive for leukocytes but otherwise negative.  Urine pregnancy test negative.  Based on symptoms and leukocytes in urine we will go ahead and treat for UTI with Macrobid twice daily for the next 5 days.  Urine culture pending.  If urine culture is negative may stop antibiotics.  Prescribe Diflucan to help with yeast infections from antibiotic use.  Encourage fluids and rest.  Tylenol and or ibuprofen  as needed.  May continue AZO for symptom management.  Follow-up as needed Final Clinical Impressions(s) / UC Diagnoses   Final diagnoses:  Lower urinary tract infectious disease     Discharge Instructions      Take the macrobid twice a day for the next 5 days.   Take the Diflucan 1 pill today.  You can take the second pill when you complete the antibiotics.  You can take Tylenol and/or Ibuprofen as needed for pain relief and fever reduction.   Make sure you are drinking plenty of fluids, especially water.  You can drink cranberry juice to help with symptom relief, but make sure it is cranberry juice and not cranberry cocktail.  You can also try AZO, cranberry pills, or pyridium as needed.    Return or go to the Emergency Department if symptoms worsen or do not improve in the next few days.      ED Prescriptions     Medication Sig Dispense Auth. Provider   nitrofurantoin, macrocrystal-monohydrate, (MACROBID) 100 MG capsule Take 1 capsule (100 mg total) by mouth 2 (two) times daily. 10 capsule Ivette Loyal, NP   fluconazole (DIFLUCAN) 150 MG tablet Take one pill today.  Take the second pill in 3 days if you are are still having symptoms. 2 tablet Ivette Loyal, NP      PDMP not reviewed this encounter.   Ivette Loyal, NP 10/22/21 [phone]

## 2021-10-22 NOTE — Discharge Instructions (Signed)
Take the macrobid twice a day for the next 5 days.   Take the Diflucan 1 pill today.  You can take the second pill when you complete the antibiotics.  You can take Tylenol and/or Ibuprofen as needed for pain relief and fever reduction.   Make sure you are drinking plenty of fluids, especially water.  You can drink cranberry juice to help with symptom relief, but make sure it is cranberry juice and not cranberry cocktail.  You can also try AZO, cranberry pills, or pyridium as needed.    Return or go to the Emergency Department if symptoms worsen or do not improve in the next few days.

## 2021-10-22 NOTE — ED Triage Notes (Signed)
Pt presents with urinary frequency and lower abdominal pain xs 4 days.

## 2021-10-23 LAB — URINE CULTURE: Culture: 20000 — AB

## 2022-01-02 ENCOUNTER — Other Ambulatory Visit: Payer: Self-pay

## 2022-01-02 ENCOUNTER — Encounter: Payer: Self-pay | Admitting: Physician Assistant

## 2022-01-02 ENCOUNTER — Other Ambulatory Visit (HOSPITAL_COMMUNITY)
Admission: RE | Admit: 2022-01-02 | Discharge: 2022-01-02 | Disposition: A | Discharge status: Home / Self Care | Payer: BC Managed Care – PPO | Source: Ambulatory Visit | Attending: Physician Assistant | Admitting: Physician Assistant

## 2022-01-02 ENCOUNTER — Ambulatory Visit (INDEPENDENT_AMBULATORY_CARE_PROVIDER_SITE_OTHER): Payer: BC Managed Care – PPO | Admitting: Physician Assistant

## 2022-01-02 VITALS — BP 144/84 | HR 75 | Temp 98.2°F | Ht 68.0 in | Wt 267.0 lb

## 2022-01-02 DIAGNOSIS — Z113 Encounter for screening for infections with a predominantly sexual mode of transmission: Secondary | ICD-10-CM | POA: Diagnosis present

## 2022-01-02 DIAGNOSIS — R030 Elevated blood-pressure reading, without diagnosis of hypertension: Secondary | ICD-10-CM | POA: Diagnosis not present

## 2022-01-02 LAB — HIV ANTIBODY (ROUTINE TESTING W REFLEX): HIV Screen 4th Generation wRfx: NONREACTIVE

## 2022-01-02 NOTE — Patient Instructions (Addendum)
Good to meet you today! Please go to the lab for blood work and I will send results through MyChart.  Please log your blood pressure readings and bring to your next appointment.  Keep working hard on your exercise and dietary changes!  Call sooner if any concerns.   Come fasting to the next appointment.

## 2022-01-02 NOTE — Progress Notes (Signed)
Subjective:    Patient ID: Katherine Hood, female    DOB: Feb 11, 1976, 46 y.o.   MRN: [id number]  Chief Complaint  Patient presents with   Exposure to STD    HPI 46 y.o. patient presents today for new patient establishment with me.  Patient was previously established with Louie Bun, approx 6 years ago. Married, one son. Works for [employer] in [employer] and says her job is stressful.   Current Care Team: Dr. Cherly Hensen, OB/GYN   Acute Concerns: -Requesting STI testing due to learning of husband's extramarital affairs over the last two years with at least two other women; no known STI exposure, but pt says she wants to be safe; denies any symptoms  -BP readings have also been elevated, no symptoms. Significant fam hx of HTN.    Past Medical History:  Diagnosis Date   Anxiety    Depression    Gestational diabetes    Gestational diabetes mellitus 08/23/2015   Gestational diabetes mellitus, antepartum    Hx of varicella    Hyperprolactinemia (HCC)    Infertility, female    Medical history non-contributory    PCOS (polycystic ovarian syndrome)    Postpartum care following cesarean delivery (9/24) 09/23/2015    Past Surgical History:  Procedure Laterality Date   CESAREAN SECTION N/A 09/23/2015   Procedure: CESAREAN SECTION;  Surgeon: Maxie Better, MD;  Location: [facility];  Service: Obstetrics;  Laterality: N/A;   HERNIA REPAIR      Family History  Problem Relation Age of Onset   Diabetes Mother    Cancer Mother    Heart disease Mother    Depression Mother    Diabetes Father    Endometriosis Sister    Depression Sister    Hypertension Sister    Diabetes Sister    Hypertension Maternal Grandmother     Social History   Tobacco Use   Smoking status: Never   Smokeless tobacco: Never  Vaping Use   Vaping Use: Never used  Substance Use Topics   Alcohol use: No   Drug use: No     No Known Allergies  Review of Systems NEGATIVE UNLESS OTHERWISE INDICATED IN  HPI      Objective:     BP (!) 144/84    Pulse 75    Temp 98.2 F (36.8 C)    Ht 5\' 8"  (1.727 m)    Wt 267 lb (121.1 kg)    SpO2 98%    BMI 40.60 kg/m   Wt Readings from Last 3 Encounters:  01/02/22 267 lb (121.1 kg)  06/17/21 265 lb (120.2 kg)  08/16/16 257 lb (116.6 kg)    BP Readings from Last 3 Encounters:  01/02/22 (!) 144/84  10/22/21 (!) 155/86  06/17/21 (!) 160/86     Physical Exam Vitals and nursing note reviewed.  Constitutional:      Appearance: Normal appearance. She is normal weight. She is not toxic-appearing.  HENT:     Head: Normocephalic and atraumatic.     Right Ear: External ear normal.     Left Ear: External ear normal.     Nose: Nose normal.     Mouth/Throat:     Mouth: Mucous membranes are moist.  Eyes:     Extraocular Movements: Extraocular movements intact.     Conjunctiva/sclera: Conjunctivae normal.     Pupils: Pupils are equal, round, and reactive to light.  Cardiovascular:     Rate and Rhythm: Normal rate and regular rhythm.  Pulses: Normal pulses.     Heart sounds: Normal heart sounds.  Pulmonary:     Effort: Pulmonary effort is normal.     Breath sounds: Normal breath sounds.  Musculoskeletal:        General: Normal range of motion.     Cervical back: Normal range of motion and neck supple.  Skin:    General: Skin is warm and dry.  Neurological:     General: No focal deficit present.     Mental Status: She is alert and oriented to person, place, and time.  Psychiatric:        Mood and Affect: Mood normal.        Behavior: Behavior normal.        Thought Content: Thought content normal.        Judgment: Judgment normal.       Assessment & Plan:   Problem List Items Addressed This Visit   None Visit Diagnoses     Screening for STDs (sexually transmitted diseases)    -  Primary   Relevant Orders   HIV Antibody (routine testing w rflx) (Completed)   HSV 1 antibody, IgG (Completed)   HSV 2 antibody, IgG (Completed)    RPR (Completed)   Urine cytology ancillary only (Completed)   Elevated blood pressure reading           1. Screening for STDs (sexually transmitted diseases) -Plan to screen for HIV, HSV, RPR, G/C/Trich -Will inform pt of results and treat if necessary   2. Elevated blood pressure reading -First appointment with patient and she has had major recent life stressor; plan to have her log BP readings at home and bring to her next appt so we can follow her trend. -She is doing very well with lifestyle and encouraged her to keep this up. -F/up in 1 month, will check fasting labs then as well    This note was prepared with assistance of Dragon voice recognition software. Occasional wrong-word or sound-a-like substitutions may have occurred due to the inherent limitations of voice recognition software.  Time Spent: 21 minutes of total time was spent on the date of the encounter performing the following actions: chart review prior to seeing the patient, obtaining history, performing a medically necessary exam, counseling on the treatment plan, placing orders, and documenting in our EHR.    Cheryllynn Sarff M Alexsis Kathman, PA-C

## 2022-01-03 LAB — HSV 2 ANTIBODY, IGG: HSV 2 IgG, Type Spec: <0.91 index (ref 0.00–0.90)

## 2022-01-03 LAB — URINE CYTOLOGY ANCILLARY ONLY
Chlamydia: NEGATIVE
Comment: NEGATIVE
Comment: NEGATIVE
Comment: NORMAL
Neisseria Gonorrhea: NEGATIVE
Trichomonas: NEGATIVE

## 2022-01-03 LAB — RPR: RPR Ser Ql: NONREACTIVE

## 2022-01-03 LAB — HSV 1 ANTIBODY, IGG: HSV 1 Glycoprotein G Ab, IgG: >62.2 index — ABNORMAL HIGH (ref 0.00–0.90)

## 2022-01-07 ENCOUNTER — Telehealth: Payer: Self-pay | Admitting: Physician Assistant

## 2022-01-07 NOTE — Telephone Encounter (Signed)
Spoke with pt regarding labs and instructions.   

## 2022-01-07 NOTE — Telephone Encounter (Signed)
Pt called and asked to be called back regarding her test results

## 2022-01-21 ENCOUNTER — Telehealth: Payer: Self-pay

## 2022-01-21 ENCOUNTER — Telehealth (INDEPENDENT_AMBULATORY_CARE_PROVIDER_SITE_OTHER): Payer: BC Managed Care – PPO | Admitting: Physician Assistant

## 2022-01-21 DIAGNOSIS — R051 Acute cough: Secondary | ICD-10-CM | POA: Diagnosis not present

## 2022-01-21 MED ORDER — BENZONATATE 100 MG PO CAPS: 100.0000 mg | ORAL_CAPSULE | Freq: Three times a day (TID) | ORAL | 0 refills | Status: AC | PRN

## 2022-01-21 NOTE — Progress Notes (Signed)
° °  Virtual Visit via Video Note  I connected with  Katherine Hood  on 01/21/22 at 11:45 AM EST by a video enabled telemedicine application and verified that I am speaking with the correct person using two identifiers.  Location: Patient: home Provider: Therapist, music at [place] present: Patient and myself   I discussed the limitations of evaluation and management by telemedicine and the availability of in person appointments. The patient expressed understanding and agreed to proceed.   History of Present Illness:  Chief complaint: Cough Symptom onset: 01/19/22 Pertinent positives: Some aching today / feels like "sickness coming on" Pertinent negatives: HA, ST, N/V/D, Abdominal pain, fever, SOB, CP Treatments tried: Tylenol Cold and Flu, Mucinex  Vaccine status: First two COVID-19 vaccines  Sick exposure: None Home COVID-19 test negative on 01/20/22    Observations/Objective:  Temp 98.4 F  Gen: Awake, alert, no acute distress; clearing throat Resp: Breathing is even and non-labored Psych: calm/pleasant demeanor Neuro: Alert and Oriented x 3, + facial symmetry, speech is clear.   Assessment and Plan:  1. Acute cough -First two days of symptoms, no other symptoms to suggest bacterial etiology at this time. -Possibly viral vs allergen etiology. -Treat conservatively - tessalon perles, fluids, tea with honey & lemon, humidifier -Consider rechecking COVID-19 test tomorrow if aching / fevers or other symptoms arise -Pt to call with updates, recheck prn   Follow Up Instructions:    I discussed the assessment and treatment plan with the patient. The patient was provided an opportunity to ask questions and all were answered. The patient agreed with the plan and demonstrated an understanding of the instructions.   The patient was advised to call back or seek an in-person evaluation if the symptoms worsen or if the condition fails to improve as  anticipated.  Katherine Hood M Cannen Dupras, PA-C

## 2022-01-21 NOTE — Telephone Encounter (Signed)
Left the patient a VM requesting a call back to go over her med list before her 11:45 appointment

## 2022-01-22 ENCOUNTER — Ambulatory Visit: Payer: BC Managed Care – PPO | Admitting: Physician Assistant

## 2022-01-24 ENCOUNTER — Telehealth (INDEPENDENT_AMBULATORY_CARE_PROVIDER_SITE_OTHER): Payer: BC Managed Care – PPO | Admitting: Physician Assistant

## 2022-01-24 DIAGNOSIS — J4521 Mild intermittent asthma with (acute) exacerbation: Secondary | ICD-10-CM

## 2022-01-24 MED ORDER — ALBUTEROL SULFATE HFA 108 (90 BASE) MCG/ACT IN AERS: 2.0000 | INHALATION_SPRAY | Freq: Four times a day (QID) | RESPIRATORY_TRACT | 2 refills | Status: DC | PRN

## 2022-01-24 MED ORDER — AZITHROMYCIN 250 MG PO TABS: ORAL_TABLET | ORAL | 0 refills | Status: AC

## 2022-01-24 MED ORDER — PREDNISONE 20 MG PO TABS: 20.0000 mg | ORAL_TABLET | Freq: Two times a day (BID) | ORAL | 0 refills | Status: AC

## 2022-01-24 MED ORDER — FLUCONAZOLE 150 MG PO TABS: 150.0000 mg | ORAL_TABLET | Freq: Every day | ORAL | 1 refills | Status: DC

## 2022-01-24 NOTE — Progress Notes (Signed)
° °  Virtual Visit via Video Note  I connected with  Katherine Hood  on 01/24/22 at 11:30 AM EST by a video enabled telemedicine application and verified that I am speaking with the correct person using two identifiers.  Location: Patient: home Provider: Nature conservation officer at [employer] Persons present: Patient and myself   I discussed the limitations of evaluation and management by telemedicine and the availability of in person appointments. The patient expressed understanding and agreed to proceed.   History of Present Illness:  Chief complaint: Cough, chest tightness Symptom onset: 01/19/22 Pertinent positives: Postnasal drip and nasal congestion, sinus pressure Pertinent negatives: HA, ST, N/V/D, Abdominal pain, fever, SOB, CP Treatments tried: Tylenol Cold and Flu, Mucinex  Vaccine status: First two COVID-19 vaccines  Sick exposure: None Home COVID-19 test negative on 01/20/22   Observations/Objective:   Gen: Awake, alert, no acute distress Resp: Breathing is even and non-labored,  tight wheezy cough Psych: calm/pleasant demeanor Neuro: Alert and Oriented x 3, + facial symmetry, speech is clear.   Assessment and Plan:  1. Mild intermittent asthmatic bronchitis with acute exacerbation -Significant fam hx of asthma including mom and twin sister (diagnosed later in life) -Symptoms seem very c/w asthmatic bronchitis -Will treat with Z-pak, prednisone, rescue inhaler -Cont to push fluids and rest -Pt knows to recheck if worse or no imp    Follow Up Instructions:    I discussed the assessment and treatment plan with the patient. The patient was provided an opportunity to ask questions and all were answered. The patient agreed with the plan and demonstrated an understanding of the instructions.   The patient was advised to call back or seek an in-person evaluation if the symptoms worsen or if the condition fails to improve as anticipated.  Osiah Haring M Atarah Cadogan,  PA-C

## 2022-01-30 ENCOUNTER — Other Ambulatory Visit: Payer: Self-pay

## 2022-01-30 ENCOUNTER — Ambulatory Visit (INDEPENDENT_AMBULATORY_CARE_PROVIDER_SITE_OTHER): Payer: BC Managed Care – PPO | Admitting: Physician Assistant

## 2022-01-30 ENCOUNTER — Encounter: Payer: Self-pay | Admitting: Physician Assistant

## 2022-01-30 VITALS — BP 154/93 | HR 64 | Temp 98.2°F | Ht 68.0 in | Wt 268.0 lb

## 2022-01-30 DIAGNOSIS — Z8249 Family history of ischemic heart disease and other diseases of the circulatory system: Secondary | ICD-10-CM

## 2022-01-30 DIAGNOSIS — Z13220 Encounter for screening for lipoid disorders: Secondary | ICD-10-CM | POA: Diagnosis not present

## 2022-01-30 DIAGNOSIS — Z833 Family history of diabetes mellitus: Secondary | ICD-10-CM

## 2022-01-30 DIAGNOSIS — Z131 Encounter for screening for diabetes mellitus: Secondary | ICD-10-CM

## 2022-01-30 DIAGNOSIS — Z1329 Encounter for screening for other suspected endocrine disorder: Secondary | ICD-10-CM

## 2022-01-30 DIAGNOSIS — I10 Essential (primary) hypertension: Secondary | ICD-10-CM

## 2022-01-30 MED ORDER — AMLODIPINE BESYLATE 2.5 MG PO TABS: 2.5000 mg | ORAL_TABLET | Freq: Every day | ORAL | 2 refills | Status: DC

## 2022-01-30 NOTE — Patient Instructions (Signed)
Good to see you today! Please go to the lab for blood work and I will send results through MyChart.  Keep up good work with your exercise and nutrition.  Start on Amlodipine 2.5 mg daily to help with blood pressure control. Monitor readings at home and bring log to next appointment.  Call if any concerns.

## 2022-01-30 NOTE — Progress Notes (Signed)
Subjective:    Patient ID: Katherine Hood, female    DOB: [birth year], 46 y.o.   MRN: [id number]  Chief Complaint  Patient presents with   Follow-up    HPI Patient is in today for follow up on her HTN.   She has BP log with her over the last few days. 134/80 186/95 164/83  States she has been holding stress in her shoulders and neck. Increased stress at work lately. Even at home though pt has had higher BP numbers.  Still working with personal trainer at least three times per week. Nutrition has been pretty good as well.   Denies any blurred vision, headaches, CP, SOB, dizziness, or other symptoms.  She is fasting today and would like labs checked as well.   Past Medical History:  Diagnosis Date   Anxiety    Depression    Gestational diabetes    Gestational diabetes mellitus 08/23/2015   Gestational diabetes mellitus, antepartum    Hx of varicella    Hyperprolactinemia (HCC)    Infertility, female    Medical history non-contributory    PCOS (polycystic ovarian syndrome)    Postpartum care following cesarean delivery (9/24) 09/23/2015    Past Surgical History:  Procedure Laterality Date   CESAREAN SECTION N/A 09/23/2015   Procedure: CESAREAN SECTION;  Surgeon: Maxie Better, MD;  Location: [facility];  Service: Obstetrics;  Laterality: N/A;   HERNIA REPAIR      Family History  Problem Relation Age of Onset   Diabetes Mother    Cancer Mother    Heart disease Mother    Depression Mother    Diabetes Father    Endometriosis Sister    Depression Sister    Hypertension Sister    Diabetes Sister    Hypertension Maternal Grandmother     Social History   Tobacco Use   Smoking status: Never   Smokeless tobacco: Never  Vaping Use   Vaping Use: Never used  Substance Use Topics   Alcohol use: No   Drug use: No     No Known Allergies  Review of Systems NEGATIVE UNLESS OTHERWISE INDICATED IN HPI      Objective:     BP (!) 154/93    Pulse 64     Temp 98.2 F (36.8 C)    Ht 5\' 8"  (1.727 m)    Wt 268 lb (121.6 kg)    SpO2 97%    BMI 40.75 kg/m   Wt Readings from Last 3 Encounters:  01/30/22 268 lb (121.6 kg)  01/02/22 267 lb (121.1 kg)  06/17/21 265 lb (120.2 kg)    BP Readings from Last 3 Encounters:  01/30/22 (!) 154/93  01/02/22 (!) 144/84  10/22/21 (!) 155/86     Physical Exam Vitals and nursing note reviewed.  Constitutional:      Appearance: Normal appearance. She is normal weight. She is not toxic-appearing.  HENT:     Head: Normocephalic and atraumatic.     Right Ear: External ear normal.     Left Ear: External ear normal.     Nose: Nose normal.     Mouth/Throat:     Mouth: Mucous membranes are moist.  Eyes:     Extraocular Movements: Extraocular movements intact.     Conjunctiva/sclera: Conjunctivae normal.     Pupils: Pupils are equal, round, and reactive to light.  Cardiovascular:     Rate and Rhythm: Normal rate and regular rhythm.     Pulses: Normal pulses.  Heart sounds: Normal heart sounds.  Pulmonary:     Effort: Pulmonary effort is normal.     Breath sounds: Normal breath sounds.  Musculoskeletal:        General: Normal range of motion.     Cervical back: Normal range of motion and neck supple.  Skin:    General: Skin is warm and dry.  Neurological:     General: No focal deficit present.     Mental Status: She is alert and oriented to person, place, and time.  Psychiatric:        Mood and Affect: Mood normal.        Behavior: Behavior normal.        Thought Content: Thought content normal.        Judgment: Judgment normal.       Assessment & Plan:   Problem List Items Addressed This Visit   None Visit Diagnoses     Essential hypertension    -  Primary   Relevant Medications   amLODipine (NORVASC) 2.5 MG tablet   Other Relevant Orders   CBC with Differential/Platelet (Completed)   Comprehensive metabolic panel (Completed)   Lipid panel (Completed)   TSH (Completed)    Diabetes mellitus screening       Relevant Orders   CBC with Differential/Platelet (Completed)   Comprehensive metabolic panel (Completed)   Hemoglobin A1c (Completed)   Screening for cholesterol level       Relevant Orders   Lipid panel (Completed)   Family history of diabetes mellitus       Relevant Orders   CBC with Differential/Platelet (Completed)   Comprehensive metabolic panel (Completed)   Hemoglobin A1c (Completed)   Lipid panel (Completed)   Family history of hypertension       Relevant Orders   CBC with Differential/Platelet (Completed)   Comprehensive metabolic panel (Completed)   Lipid panel (Completed)   Thyroid disorder screen       Relevant Orders   TSH (Completed)        Meds ordered this encounter  Medications   amLODipine (NORVASC) 2.5 MG tablet    Sig: Take 1 tablet (2.5 mg total) by mouth daily.    Dispense:  30 tablet    Refill:  2    Plan: -Blood pressure readings are elevated, more than 3 readings confirmed over 120/80, she has HTN and will need to start on medication at this time. -Will start on Norvasc 2.5 mg daily. Discussed possible SE. -She will cont to log numbers at home. She will cont to work hard on health goals. -Fasting labs drawn today, will call with results. -F/up in 2 months for med and BP check or prn  Phoenyx Melka M Makia Bossi, PA-C

## 2022-01-31 LAB — COMPREHENSIVE METABOLIC PANEL
ALT: 9 IU/L (ref 0–32)
AST: 12 IU/L (ref 0–40)
Albumin/Globulin Ratio: 1.4 (ref 1.2–2.2)
Albumin: 4.6 g/dL (ref 3.8–4.8)
Alkaline Phosphatase: 102 IU/L (ref 44–121)
BUN/Creatinine Ratio: 11 (ref 9–23)
BUN: 10 mg/dL (ref 6–24)
Bilirubin Total: 0.3 mg/dL (ref 0.0–1.2)
CO2: 21 mmol/L (ref 20–29)
Calcium: 10.1 mg/dL (ref 8.7–10.2)
Chloride: 102 mmol/L (ref 96–106)
Creatinine, Ser: 0.9 mg/dL (ref 0.57–1.00)
Globulin, Total: 3.2 g/dL (ref 1.5–4.5)
Glucose: 101 mg/dL — ABNORMAL HIGH (ref 70–99)
Potassium: 4.3 mmol/L (ref 3.5–5.2)
Sodium: 140 mmol/L (ref 134–144)
Total Protein: 7.8 g/dL (ref 6.0–8.5)
eGFR: 81 mL/min/{1.73_m2} (ref >59)

## 2022-01-31 LAB — CBC WITH DIFFERENTIAL/PLATELET
Basophils Absolute: 0.1 10*3/uL (ref 0.0–0.2)
Basos: 1 %
EOS (ABSOLUTE): 0 10*3/uL (ref 0.0–0.4)
Eos: 0 %
Hematocrit: 42.5 % (ref 34.0–46.6)
Hemoglobin: 13.8 g/dL (ref 11.1–15.9)
Immature Grans (Abs): 0 10*3/uL (ref 0.0–0.1)
Immature Granulocytes: 0 %
Lymphocytes Absolute: 3.2 10*3/uL — ABNORMAL HIGH (ref 0.7–3.1)
Lymphs: 33 %
MCH: 27.4 pg (ref 26.6–33.0)
MCHC: 32.5 g/dL (ref 31.5–35.7)
MCV: 84 fL (ref 79–97)
Monocytes Absolute: 0.8 10*3/uL (ref 0.1–0.9)
Monocytes: 8 %
Neutrophils Absolute: 5.5 10*3/uL (ref 1.4–7.0)
Neutrophils: 58 %
Platelets: 410 10*3/uL (ref 150–450)
RBC: 5.04 x10E6/uL (ref 3.77–5.28)
RDW: 13.4 % (ref 11.7–15.4)
WBC: 9.6 10*3/uL (ref 3.4–10.8)

## 2022-01-31 LAB — LIPID PANEL
Chol/HDL Ratio: 3.5 ratio (ref 0.0–4.4)
Cholesterol, Total: 187 mg/dL (ref 100–199)
HDL: 54 mg/dL (ref >39)
LDL Chol Calc (NIH): 106 mg/dL — ABNORMAL HIGH (ref 0–99)
Triglycerides: 154 mg/dL — ABNORMAL HIGH (ref 0–149)
VLDL Cholesterol Cal: 27 mg/dL (ref 5–40)

## 2022-01-31 LAB — HEMOGLOBIN A1C
Est. average glucose Bld gHb Est-mCnc: 123 mg/dL
Hgb A1c MFr Bld: 5.9 % — ABNORMAL HIGH (ref 4.8–5.6)

## 2022-01-31 LAB — TSH: TSH: 1.83 u[IU]/mL (ref 0.450–4.500)

## 2022-04-10 ENCOUNTER — Ambulatory Visit (INDEPENDENT_AMBULATORY_CARE_PROVIDER_SITE_OTHER): Payer: BC Managed Care – PPO | Admitting: Physician Assistant

## 2022-04-10 VITALS — BP 144/89 | HR 85 | Temp 98.2°F | Ht 68.0 in | Wt 269.2 lb

## 2022-04-10 DIAGNOSIS — I10 Essential (primary) hypertension: Secondary | ICD-10-CM

## 2022-04-10 DIAGNOSIS — R7303 Prediabetes: Secondary | ICD-10-CM | POA: Diagnosis not present

## 2022-04-10 MED ORDER — AMLODIPINE BESYLATE 5 MG PO TABS: 5.0000 mg | ORAL_TABLET | Freq: Every day | ORAL | 1 refills | Status: DC

## 2022-04-10 NOTE — Progress Notes (Signed)
? ?Subjective:  ? ? Patient ID: Katherine Hood, female    DOB: May 28, 1976, 46 y.o.   MRN: [id number] ? ?Chief Complaint  ?Patient presents with  ? Hypertension  ?  Pt here to F/U with Bp.  ? ? ?HPI ?Patient is in today for BP recheck. ? ?HTN: ?Still taking Norvasc 2.5 mg daily.  ?Highest it has been is 154/?; but not in the 180s like it was running occasionally.  ?Trending in 140s/80s on average. ?Added 2 days of cardio and then walking / weight training the rest of the week.  ? ?No headaches or CP. No SOB. No dizziness. No other concerns.  ? ?She also wanted to review her recent labs again.  ? ?Past Medical History:  ?Diagnosis Date  ? Anxiety   ? Depression   ? Gestational diabetes   ? Gestational diabetes mellitus 08/23/2015  ? Gestational diabetes mellitus, antepartum   ? Hx of varicella   ? Hyperprolactinemia ([place])   ? Infertility, female   ? Medical history non-contributory   ? PCOS (polycystic ovarian syndrome)   ? Postpartum care following cesarean delivery (9/24) 09/23/2015  ? ? ?Past Surgical History:  ?Procedure Laterality Date  ? CESAREAN SECTION N/A 09/23/2015  ? Procedure: CESAREAN SECTION;  Surgeon: Servando Salina, MD;  Location: [facility];  Service: Obstetrics;  Laterality: N/A;  ? HERNIA REPAIR    ? ? ?Family History  ?Problem Relation Age of Onset  ? Diabetes Mother   ? Cancer Mother   ? Heart disease Mother   ? Depression Mother   ? Diabetes Father   ? Endometriosis Sister   ? Depression Sister   ? Hypertension Sister   ? Diabetes Sister   ? Hypertension Maternal Grandmother   ? ? ?Social History  ? ?Tobacco Use  ? Smoking status: Never  ? Smokeless tobacco: Never  ?Vaping Use  ? Vaping Use: Never used  ?Substance Use Topics  ? Alcohol use: No  ? Drug use: No  ?  ? ?No Known Allergies ? ?Review of Systems ?NEGATIVE UNLESS OTHERWISE INDICATED IN HPI ? ? ?   ?Objective:  ?  ? ?BP (!) 144/89   Pulse 85   Temp 98.2 ?F (36.8 ?C)   Ht 5\' 8"  (1.727 m)   Wt 269 lb 3.2 oz (122.1 kg)   SpO2 98%   BMI  40.93 kg/m?  ? ?Wt Readings from Last 3 Encounters:  ?04/10/22 269 lb 3.2 oz (122.1 kg)  ?01/30/22 268 lb (121.6 kg)  ?01/02/22 267 lb (121.1 kg)  ? ? ?BP Readings from Last 3 Encounters:  ?04/10/22 (!) 144/89  ?01/30/22 (!) 154/93  ?01/02/22 (!) 144/84  ?  ? ?Physical Exam ?Constitutional:   ?   Appearance: Normal appearance. She is obese.  ?Cardiovascular:  ?   Rate and Rhythm: Normal rate and regular rhythm.  ?   Pulses: Normal pulses.  ?Pulmonary:  ?   Effort: Pulmonary effort is normal.  ?   Breath sounds: Normal breath sounds.  ?Neurological:  ?   General: No focal deficit present.  ?   Mental Status: She is alert.  ?Psychiatric:     ?   Mood and Affect: Mood normal.     ?   Behavior: Behavior normal.  ? ? ?   ?Assessment & Plan:  ? ?Problem List Items Addressed This Visit   ?None ?Visit Diagnoses   ? ? Essential hypertension    -  Primary  ? Relevant Medications  ?  amLODipine (NORVASC) 5 MG tablet  ? Prediabetes      ? ?  ? ? ? ?Meds ordered this encounter  ?Medications  ? amLODipine (NORVASC) 5 MG tablet  ?  Sig: Take 1 tablet (5 mg total) by mouth daily.  ?  Dispense:  90 tablet  ?  Refill:  1  ? ? ?1. Essential hypertension ?Still elevated, but better ?Inc Norvasc to 5 mg daily ?Monitor at home ?Watch for edema ?Call if any issues ? ?2. Prediabetes ?Reviewed last Ha1c was 5.9 & glucose 101 ?Lab Results  ?Component Value Date  ? HGBA1C 5.9 (H) 01/30/2022  ? ?STRONG fam hx of T2DM ?Needs to keep working hard on diet / exercise as she has been ?Consider Metformin  ? ?F/up in 3 months or prn  ? ? ?Brennan Litzinger M Brynn Reznik, PA-C ?

## 2022-04-12 ENCOUNTER — Ambulatory Visit (HOSPITAL_COMMUNITY)
Admission: RE | Admit: 2022-04-12 | Discharge: 2022-04-12 | Disposition: A | Discharge status: Home / Self Care | Payer: BC Managed Care – PPO | Source: Ambulatory Visit | Attending: Family Medicine | Admitting: Family Medicine

## 2022-04-12 ENCOUNTER — Encounter (HOSPITAL_COMMUNITY): Payer: Self-pay

## 2022-04-12 VITALS — BP 145/63 | HR 77 | Temp 99.2°F | Resp 16

## 2022-04-12 DIAGNOSIS — J029 Acute pharyngitis, unspecified: Secondary | ICD-10-CM

## 2022-04-12 DIAGNOSIS — J020 Streptococcal pharyngitis: Secondary | ICD-10-CM

## 2022-04-12 LAB — POCT RAPID STREP A, ED / UC: Streptococcus, Group A Screen (Direct): POSITIVE — AB

## 2022-04-12 MED ORDER — PENICILLIN V POTASSIUM 500 MG PO TABS: 500.0000 mg | ORAL_TABLET | Freq: Two times a day (BID) | ORAL | 0 refills | Status: AC

## 2022-04-12 MED ORDER — FLUCONAZOLE 150 MG PO TABS: 150.0000 mg | ORAL_TABLET | Freq: Every day | ORAL | 0 refills | Status: DC

## 2022-04-12 MED ORDER — ACETAMINOPHEN 325 MG PO TABS
ORAL_TABLET | ORAL | Status: AC
  Filled 2022-04-12: qty 2

## 2022-04-12 MED ORDER — ACETAMINOPHEN 325 MG PO TABS
650.0000 mg | ORAL_TABLET | Freq: Once | ORAL | Status: AC
  Administered 2022-04-12: 650 mg via ORAL

## 2022-04-12 NOTE — ED Triage Notes (Signed)
C/o sore throat and left ear pain x 2 days.  ?

## 2022-04-12 NOTE — ED Provider Notes (Signed)
?[facility] ? ? ? ?CSN: [id number] ?Arrival date & time: 04/12/22  1426 ? ? ?  ? ?History   ?Chief Complaint ?Chief Complaint  ?Patient presents with  ? Sore Throat  ?  Entered by patient  ? Ear Pain  ? ? ?HPI ?Katherine Hood is a 46 y.o. female who presents with onset of ST and L ear pain x 2 days. She has not checked her temp. Last night she had trouble sleeping since her neck glands that are swollen were aching from just laying down. She has hx of tonsillitis, and not always bacterial reglated ? ? ? ?Past Medical History:  ?Diagnosis Date  ? Anxiety   ? Depression   ? Gestational diabetes   ? Gestational diabetes mellitus 08/23/2015  ? Gestational diabetes mellitus, antepartum   ? Hx of varicella   ? Hyperprolactinemia (HCC)   ? Infertility, female   ? Medical history non-contributory   ? PCOS (polycystic ovarian syndrome)   ? Postpartum care following cesarean delivery (9/24) 09/23/2015  ? ? ?Patient Active Problem List  ? Diagnosis Date Noted  ? Major depressive disorder, recurrent episode, in full remission (HCC) 07/06/2019  ? Other mixed anxiety disorders 07/06/2019  ? PIH (pregnancy induced hypertension) 09/24/2015  ? Postpartum care following cesarean delivery (9/24) 09/23/2015  ? GDM, class A2 09/22/2015  ? ? ?Past Surgical History:  ?Procedure Laterality Date  ? CESAREAN SECTION N/A 09/23/2015  ? Procedure: CESAREAN SECTION;  Surgeon: Maxie Better, MD;  Location: [facility];  Service: Obstetrics;  Laterality: N/A;  ? HERNIA REPAIR    ? ? ?OB History   ? ? Gravida  ?2  ? Para  ?1  ? Term  ?1  ? Preterm  ?   ? AB  ?1  ? Living  ?0  ?  ? ? SAB  ?   ? IAB  ?   ? Ectopic  ?1  ? Multiple  ?0  ? Live Births  ?   ?   ?  ?  ? ? ? ?Home Medications   ? ?Prior to Admission medications   ?Medication Sig Start Date End Date Taking? Authorizing Provider  ?fluconazole (DIFLUCAN) 150 MG tablet Take 1 tablet (150 mg total) by mouth daily. 04/12/22  Yes Rodriguez-Southworth, Nettie Elm, PA-C  ?penicillin v potassium  (VEETID) 500 MG tablet Take 1 tablet (500 mg total) by mouth 2 (two) times daily for 10 days. 04/12/22 04/22/22 Yes Rodriguez-Southworth, Nettie Elm, PA-C  ?albuterol (VENTOLIN HFA) 108 (90 Base) MCG/ACT inhaler Inhale 2 puffs into the lungs every 6 (six) hours as needed for wheezing or shortness of breath. 01/24/22   Allwardt, Alyssa M, PA-C  ?amLODipine (NORVASC) 2.5 MG tablet Take 1 tablet (2.5 mg total) by mouth daily. 01/30/22 03/01/22  Allwardt, Crist Infante, PA-C  ?amLODipine (NORVASC) 5 MG tablet Take 1 tablet (5 mg total) by mouth daily. 04/10/22 07/09/22  Allwardt, Crist Infante, PA-C  ?cetirizine (ZYRTEC ALLERGY) 10 MG tablet Take 1 tablet (10 mg total) by mouth daily. 06/08/21   Wallis Bamberg, PA-C  ?fluticasone (FLONASE) 50 MCG/ACT nasal spray Place 1 spray into both nostrils daily. 08/02/20   Dahlia Byes A, NP  ?ibuprofen (ADVIL) 600 MG tablet Take 1 tablet (600 mg total) by mouth every 6 (six) hours as needed. 06/17/21   Eber Hong, MD  ?loratadine (CLARITIN) 10 MG tablet Take 10 mg by mouth daily.    [provider]  ?methocarbamol (ROBAXIN) 500 MG tablet Take 1 tablet (500  mg total) by mouth 2 (two) times daily as needed for muscle spasms. 06/17/21   Eber HongMiller, Brian, MD  ?pseudoephedrine (SUDAFED) 60 MG tablet Take 1 tablet (60 mg total) by mouth every 8 (eight) hours as needed for congestion. 06/08/21   Wallis BambergMani, Mario, PA-C  ?sodium chloride (OCEAN) 0.65 % SOLN nasal spray Place 1 spray into both nostrils as needed for congestion. 03/15/19   Georgetta HaberBurky, Natalie B, NP  ?triamcinolone (NASACORT) 55 MCG/ACT AERO nasal inhaler Place 2 sprays into the nose daily. 03/15/19 08/02/20  Georgetta HaberBurky, Natalie B, NP  ? ? ?Family History ?Family History  ?Problem Relation Age of Onset  ? Diabetes Mother   ? Cancer Mother   ? Heart disease Mother   ? Depression Mother   ? Diabetes Father   ? Endometriosis Sister   ? Depression Sister   ? Hypertension Sister   ? Diabetes Sister   ? Hypertension Maternal Grandmother   ? ? ?Social History ?Social  History  ? ?Tobacco Use  ? Smoking status: Never  ? Smokeless tobacco: Never  ?Vaping Use  ? Vaping Use: Never used  ?Substance Use Topics  ? Alcohol use: No  ? Drug use: No  ? ? ? ?Allergies   ?Patient has no known allergies. ? ? ?Review of Systems ?Review of Systems  ?Constitutional:  Positive for chills and fatigue. Negative for appetite change and fever.  ?HENT:  Positive for sore throat and trouble swallowing. Negative for congestion, ear discharge, ear pain, postnasal drip and rhinorrhea.   ?Eyes:  Negative for discharge.  ?Respiratory:  Negative for cough.   ?Skin:  Negative for rash.  ?Hematological:  Negative for adenopathy.  ? ? ?Physical Exam ?Triage Vital Signs ?ED Triage Vitals [04/12/22 1522]  ?Enc Vitals Group  ?   BP (!) 145/63  ?   Pulse Rate 77  ?   Resp 16  ?   Temp 99.2 ?F (37.3 ?C)  ?   Temp Source Oral  ?   SpO2 97 %  ?   Weight   ?   Height   ?   Head Circumference   ?   Peak Flow   ?   Pain Score   ?   Pain Loc   ?   Pain Edu?   ?   Excl. in GC?   ? ?No data found. ? ?Updated Vital Signs ?BP (!) 145/63 (BP Location: Left Arm)   Pulse 77   Temp 99.2 ?F (37.3 ?C) (Oral)   Resp 16   SpO2 97%  ? ?Visual Acuity ?Right Eye Distance:   ?Left Eye Distance:   ?Bilateral Distance:   ? ?Right Eye Near:   ?Left Eye Near:    ?Bilateral Near:    ? ? ?Physical Exam ?Vitals signs and nursing note reviewed.  ?Constitutional:   ?   General: She is not in acute distress. ?   Appearance: Normal appearance. She is not ill-appearing, toxic-appearing or diaphoretic.  ?HENT:  ?   Head: Normocephalic.  ?   Right Ear: Tympanic membrane, ear canal and external ear normal.  ?   Left Ear: Tympanic membrane, ear canal and external ear normal.  ?   Nose: Nose normal.  ?   Mouth/Throat: erythematous. Uvula is normal and midline ?   Mouth: Mucous membranes are moist.  ?Eyes:  ?   General: No scleral icterus.    ?   Right eye: No discharge.     ?   Left eye:  No discharge.  ?   Conjunctiva/sclera: Conjunctivae normal.   ?Neck:  ?   Musculoskeletal: Neck supple. No neck rigidity.  ?Cardiovascular:  ?   Rate and Rhythm: Normal rate and regular rhythm.  ?   Heart sounds: No murmur.  ?Pulmonary:  ?   Effort: Pulmonary effort is normal.  ?   Breath sounds: Normal breath sounds.  ?Musculoskeletal: Normal range of motion.  ?Lymphadenopathy:  ?   Cervical: No cervical adenopathy.  ?Skin: ?   General: Skin is warm and dry.  ?   Coloration: Skin is not jaundiced.  ?   Findings: No rash.  ?Neurological:  ?   Mental Status: She is alert and oriented to person, place, and time.  ?   Gait: Gait normal.  ?Psychiatric:     ?   Mood and Affect: Mood normal.     ?   Behavior: Behavior normal.     ?   Thought Content: Thought content normal.     ?   Judgment: Judgment normal.  ? ? ?UC Treatments / Results  ?Labs ?(all labs ordered are listed, but only abnormal results are displayed) ?Labs Reviewed  ?POCT RAPID STREP A, ED / UC - Abnormal; Notable for the following components:  ?    Result Value  ? Streptococcus, Group A Screen (Direct) POSITIVE (*)   ? All other components within normal limits  ? ? ?EKG ? ? ?Radiology ?No results found. ? ?Procedures ?Procedures (including critical care time) ? ?Medications Ordered in UC ?Medications  ?acetaminophen (TYLENOL) tablet 650 mg (650 mg Oral Given 04/12/22 1619)  ? ? ?Initial Impression / Assessment and Plan / UC Course  ?I have reviewed the triage vital signs and the nursing notes. ? ?Pertinent labs  results that were available during my care of the patient were reviewed by me and considered in my medical decision making (see chart for details). ? ?Has strep throat ?She was placed on Penicillin as noted.  ?See instructions ? ? ?Final Clinical Impressions(s) / UC Diagnoses  ? ?Final diagnoses:  ?Sore throat  ?Streptococcal sore throat  ? ? ? ?Discharge Instructions   ? ?  ?Throw away your toothbrush 24 hours after starting the antibiotic ?Take anything you need for pain ?You may gargle with warm salt water  and use throat sprays for pain ? ? ? ? ?ED Prescriptions   ? ? Medication Sig Dispense Auth. Provider  ? penicillin v potassium (VEETID) 500 MG tablet Take 1 tablet (500 mg total) by mouth 2 (two) times daily for

## 2022-04-12 NOTE — Discharge Instructions (Signed)
Throw away your toothbrush 24 hours after starting the antibiotic ?Take anything you need for pain ?You may gargle with warm salt water and use throat sprays for pain ?

## 2022-04-27 ENCOUNTER — Other Ambulatory Visit: Payer: Self-pay | Admitting: Physician Assistant

## 2022-04-29 ENCOUNTER — Other Ambulatory Visit: Payer: Self-pay

## 2022-05-17 ENCOUNTER — Ambulatory Visit (INDEPENDENT_AMBULATORY_CARE_PROVIDER_SITE_OTHER)
Admission: RE | Admit: 2022-05-17 | Discharge: 2022-05-17 | Disposition: A | Discharge status: Home / Self Care | Payer: BC Managed Care – PPO | Source: Ambulatory Visit | Attending: Physician Assistant | Admitting: Physician Assistant

## 2022-05-17 ENCOUNTER — Encounter: Payer: Self-pay | Admitting: Physician Assistant

## 2022-05-17 ENCOUNTER — Ambulatory Visit (INDEPENDENT_AMBULATORY_CARE_PROVIDER_SITE_OTHER): Payer: BC Managed Care – PPO | Admitting: Physician Assistant

## 2022-05-17 VITALS — BP 138/90 | HR 74 | Temp 98.0°F | Ht 68.0 in | Wt 273.6 lb

## 2022-05-17 DIAGNOSIS — M62830 Muscle spasm of back: Secondary | ICD-10-CM

## 2022-05-17 DIAGNOSIS — M545 Low back pain, unspecified: Secondary | ICD-10-CM
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
## 2022-07-10 ENCOUNTER — Encounter: Payer: Self-pay | Admitting: Physician Assistant

## 2022-07-10 ENCOUNTER — Ambulatory Visit (INDEPENDENT_AMBULATORY_CARE_PROVIDER_SITE_OTHER): Payer: BC Managed Care – PPO | Admitting: Physician Assistant

## 2022-07-10 VITALS — BP 134/86 | HR 66 | Temp 98.2°F | Ht 68.0 in | Wt 272.6 lb

## 2022-07-10 DIAGNOSIS — R7303 Prediabetes: Secondary | ICD-10-CM | POA: Diagnosis not present

## 2022-07-10 DIAGNOSIS — M7661 Achilles tendinitis, right leg: Secondary | ICD-10-CM

## 2022-07-10 DIAGNOSIS — I10 Essential (primary) hypertension: Secondary | ICD-10-CM | POA: Diagnosis not present

## 2022-07-10 DIAGNOSIS — Z1211 Encounter for screening for malignant neoplasm of colon: Secondary | ICD-10-CM | POA: Diagnosis not present

## 2022-07-10 DIAGNOSIS — E7800 Pure hypercholesterolemia, unspecified: Secondary | ICD-10-CM

## 2022-07-10 DIAGNOSIS — Z1159 Encounter for screening for other viral diseases: Secondary | ICD-10-CM

## 2022-07-10 MED ORDER — AMLODIPINE BESYLATE 5 MG PO TABS: 5.0000 mg | ORAL_TABLET | Freq: Every day | ORAL | 1 refills | Status: DC

## 2022-07-10 MED ORDER — METFORMIN HCL ER 500 MG PO TB24: 500.0000 mg | ORAL_TABLET | Freq: Every day | ORAL | 2 refills | Status: DC

## 2022-07-10 MED ORDER — SEMAGLUTIDE-WEIGHT MANAGEMENT 0.25 MG/0.5ML ~~LOC~~ SOAJ
0.2500 mg | SUBCUTANEOUS | 0 refills | Status: DC
Start: 2022-07-10 — End: 2023-05-19

## 2022-07-10 NOTE — Patient Instructions (Signed)
Keep up great work!!! See you back in 3 months or as needed.

## 2022-07-10 NOTE — Progress Notes (Signed)
Subjective:    Patient ID: Katherine Hood, female    DOB: April 24, 1976, 46 y.o.   MRN: [id number]  Chief Complaint  Patient presents with   Follow-up    Pt being seen for 3 mon f/u; pt is fasting for labs today; pt R foot has been painful to walk on it and bearing weight, hurts to touch; pt needs referral for eye exam; pt requesting referral for Colonoscopy since now due at 46.     HPI Patient is in today for 3 month f/up. Prediabetes, morbid obesity, HTN.  BP has been doing very well since amlodipine 2.5 mg daily. 130s/80s at home.  Weight concerns: Delrae Rend MD last year - hit 285 lbs, plateaued after that -Then tried [place], went to 265 lbs, but started having high sodium / BP issues; stopped the program.  -Intermittent fasting 12- 6 pm, mostly salads, vegs.  -Exercises three times weekly; trying to find water aerobics -Twin sister on Ozempic, has lost 30 lbs in 6 months.  R achilles tendonitis last few weeks off/on - 7/10 pain today, hard to walk on; Aleve, ibuprofen  Past Medical History:  Diagnosis Date   Anxiety    Depression    Gestational diabetes    Gestational diabetes mellitus 08/23/2015   Gestational diabetes mellitus, antepartum    Hx of varicella    Hyperprolactinemia (HCC)    Infertility, female    Medical history non-contributory    PCOS (polycystic ovarian syndrome)    Postpartum care following cesarean delivery (9/24) 09/23/2015    Past Surgical History:  Procedure Laterality Date   CESAREAN SECTION N/A 09/23/2015   Procedure: CESAREAN SECTION;  Surgeon: Maxie Better, MD;  Location: [facility];  Service: Obstetrics;  Laterality: N/A;   HERNIA REPAIR      Family History  Problem Relation Age of Onset   Diabetes Mother    Cancer Mother    Heart disease Mother    Depression Mother    Diabetes Father    Endometriosis Sister    Depression Sister    Hypertension Sister    Diabetes Sister    Hypertension Maternal Grandmother     Social  History   Tobacco Use   Smoking status: Never   Smokeless tobacco: Never  Vaping Use   Vaping Use: Never used  Substance Use Topics   Alcohol use: No   Drug use: No     Allergies  Allergen Reactions   Latex     Review of Systems NEGATIVE UNLESS OTHERWISE INDICATED IN HPI      Objective:     BP 134/86 (BP Location: Right Arm)   Pulse 66   Temp 98.2 F (36.8 C) (Temporal)   Ht 5\' 8"  (1.727 m)   Wt 272 lb 9.6 oz (123.7 kg)   LMP 07/04/2022 (Exact Date)   SpO2 100%   BMI 41.45 kg/m   Wt Readings from Last 3 Encounters:  07/10/22 272 lb 9.6 oz (123.7 kg)  05/17/22 273 lb 9.6 oz (124.1 kg)  04/10/22 269 lb 3.2 oz (122.1 kg)    BP Readings from Last 3 Encounters:  07/10/22 134/86  05/17/22 138/90  04/12/22 (!) 145/63     Physical Exam Constitutional:      Appearance: Normal appearance. She is morbidly obese.  Eyes:     Extraocular Movements: Extraocular movements intact.     Conjunctiva/sclera: Conjunctivae normal.     Pupils: Pupils are equal, round, and reactive to light.  Cardiovascular:  Rate and Rhythm: Normal rate and regular rhythm.     Pulses: Normal pulses.  Pulmonary:     Effort: Pulmonary effort is normal.     Breath sounds: Normal breath sounds.  Musculoskeletal:     Comments: Achille's tendon intact; very tender with palpation  Skin:    General: Skin is warm.  Neurological:     General: No focal deficit present.     Mental Status: She is alert.  Psychiatric:        Mood and Affect: Mood normal.        Behavior: Behavior normal.        Assessment & Plan:   Problem List Items Addressed This Visit   None Visit Diagnoses     Essential hypertension    -  Primary   Relevant Medications   amLODipine (NORVASC) 5 MG tablet   Other Relevant Orders   Basic Metabolic Panel (BMET)   Lipid panel   Prediabetes       Relevant Orders   Basic Metabolic Panel (BMET)   Hemoglobin A1c   Morbid obesity (HCC)       Relevant Medications    metFORMIN (GLUCOPHAGE-XR) 500 MG 24 hr tablet   Semaglutide-Weight Management 0.25 MG/0.5ML SOAJ   Other Relevant Orders   Basic Metabolic Panel (BMET)   Hemoglobin A1c   Lipid panel   Screening for colon cancer       Relevant Orders   Ambulatory referral to Gastroenterology   Right Achilles tendinitis       Relevant Orders   Ambulatory referral to Sports Medicine   Need for hepatitis C screening test       Relevant Orders   Hepatitis C Antibody   Elevated low density lipoprotein (LDL) cholesterol level       Relevant Orders   Lipid panel        Meds ordered this encounter  Medications   amLODipine (NORVASC) 5 MG tablet    Sig: Take 1 tablet (5 mg total) by mouth daily.    Dispense:  90 tablet    Refill:  1    Order Specific Question:   Supervising Provider    Answer:   Shelva Majestic [[id number]]   metFORMIN (GLUCOPHAGE-XR) 500 MG 24 hr tablet    Sig: Take 1 tablet (500 mg total) by mouth daily with breakfast.    Dispense:  30 tablet    Refill:  2    Order Specific Question:   Supervising Provider    Answer:   Shelva Majestic [[id number]]   Semaglutide-Weight Management 0.25 MG/0.5ML SOAJ    Sig: Inject 0.25 mg into the skin once a week.    Dispense:  2 mL    Refill:  0    Order Specific Question:   Supervising Provider    Answer:   Shelva Majestic [[id number]]   PLAN: -Cont on amlodipine 5 mg daily, BP looks great -Keep up good work with lifestyle changes; back off lower leg work right now b/c of achille's tendonitis - referral to sports med for additional help here -Start on Metformin and Wegovy as directed to help with weight loss journey. Pt aware of risks vs benefits and possible adverse reactions. -Referral for screening colonoscopy   Return in about 3 months (around 10/10/2022) for recheck.   Katherine Duddy M Linette Gunderson, PA-C

## 2022-07-11 ENCOUNTER — Telehealth: Payer: Self-pay

## 2022-07-11 LAB — LIPID PANEL
Chol/HDL Ratio: 3.8 ratio (ref 0.0–4.4)
Cholesterol, Total: 177 mg/dL (ref 100–199)
HDL: 46 mg/dL (ref >39)
LDL Chol Calc (NIH): 119 mg/dL — ABNORMAL HIGH (ref 0–99)
Triglycerides: 60 mg/dL (ref 0–149)
VLDL Cholesterol Cal: 12 mg/dL (ref 5–40)

## 2022-07-11 LAB — BASIC METABOLIC PANEL
BUN/Creatinine Ratio: 9 (ref 9–23)
BUN: 8 mg/dL (ref 6–24)
CO2: 23 mmol/L (ref 20–29)
Calcium: 9.7 mg/dL (ref 8.7–10.2)
Chloride: 107 mmol/L — ABNORMAL HIGH (ref 96–106)
Creatinine, Ser: 0.91 mg/dL (ref 0.57–1.00)
Glucose: 96 mg/dL (ref 70–99)
Potassium: 4.6 mmol/L (ref 3.5–5.2)
Sodium: 142 mmol/L (ref 134–144)
eGFR: 79 mL/min/{1.73_m2} (ref >59)

## 2022-07-11 LAB — HEMOGLOBIN A1C
Est. average glucose Bld gHb Est-mCnc: 117 mg/dL
Hgb A1c MFr Bld: 5.7 % — ABNORMAL HIGH (ref 4.8–5.6)

## 2022-07-11 LAB — HEPATITIS C ANTIBODY: Hep C Virus Ab: NONREACTIVE

## 2022-07-11 NOTE — Telephone Encounter (Signed)
Katherine Hood (Key: [id number]) Rx #: [id number] Ozempic (0.25 or 0.5 MG/DOSE) 2MG /3ML pen-injectors  Waiting on determination

## 2022-07-12 NOTE — Telephone Encounter (Signed)
PA denied due to not having a Dx of having Type 2 Diabetes. Denial letter placed to be scanned in patient chart.

## 2022-07-19 NOTE — Progress Notes (Unsigned)
   I, Katherine Hood, LAT, ATC acting as a scribe for Katherine Graham, MD.  Subjective:    CC: R Achille's pain  HPI: Pt is a 46 y/o female c/o R Achille's pain x /. Pt locates pain to   R Achille's swelling: Aggravates: TTP Treatments tried: naproxen, IBU  Pertinent review of Systems: ***  Relevant historical information: ***   Objective:   There were no vitals filed for this visit. General: Well Developed, well nourished, and in no acute distress.   MSK: ***  Lab and Radiology Results No results found for this or any previous visit (from the past 72 hour(s)). No results found.    Impression and Recommendations:    Assessment and Plan: 46 y.o. female with ***.  PDMP not reviewed this encounter. No orders of the defined types were placed in this encounter.  No orders of the defined types were placed in this encounter.   Discussed warning signs or symptoms. Please see discharge instructions. Patient expresses understanding.   ***

## 2022-07-22 ENCOUNTER — Ambulatory Visit: Payer: Self-pay

## 2022-07-22 ENCOUNTER — Encounter: Payer: Self-pay | Admitting: Family Medicine

## 2022-07-22 ENCOUNTER — Ambulatory Visit (INDEPENDENT_AMBULATORY_CARE_PROVIDER_SITE_OTHER): Payer: BC Managed Care – PPO | Admitting: Family Medicine

## 2022-07-22 VITALS — BP 130/80 | HR 74 | Ht 68.0 in | Wt 272.8 lb

## 2022-07-22 DIAGNOSIS — M7661 Achilles tendinitis, right leg: Secondary | ICD-10-CM | POA: Diagnosis not present

## 2022-07-22 MED ORDER — NITROGLYCERIN 0.2 MG/HR TD PT24: MEDICATED_PATCH | TRANSDERMAL | 1 refills | Status: DC

## 2022-07-22 NOTE — Patient Instructions (Addendum)
Nice to meet you today.  Please perform the exercise program that we have prepared for you and gone over in detail on a daily basis.  In addition to the handout you were provided you can access your program through: [email or web address]   Your unique program code is:   [id number]  Nitroglycerin Protocol  Apply 1/4 nitroglycerin patch to affected area daily. Change position of patch within the affected area every 24 hours. You may experience a headache during the first 1-2 weeks of using the patch, these should subside. If you experience headaches after beginning nitroglycerin patch treatment, you may take your preferred over the counter pain reliever. Another side effect of the nitroglycerin patch is skin irritation or rash related to patch adhesive. Please notify our office if you develop more severe headaches or rash, and stop the patch. Tendon healing with nitroglycerin patch may require 12 to 24 weeks depending on the extent of injury. Men should not use if taking Viagra, Cialis, or Levitra.  Do not use if you have migraines or rosacea.    Follow-up: one month

## 2022-08-23 ENCOUNTER — Ambulatory Visit: Payer: BC Managed Care – PPO | Admitting: Family Medicine

## 2022-08-27 ENCOUNTER — Ambulatory Visit: Payer: Self-pay

## 2022-08-27 ENCOUNTER — Ambulatory Visit (INDEPENDENT_AMBULATORY_CARE_PROVIDER_SITE_OTHER): Payer: BC Managed Care – PPO | Admitting: Family Medicine

## 2022-08-27 VITALS — BP 156/88 | HR 74 | Ht 68.0 in | Wt 277.6 lb

## 2022-08-27 DIAGNOSIS — M7661 Achilles tendinitis, right leg: Secondary | ICD-10-CM | POA: Diagnosis not present

## 2022-08-27 NOTE — Progress Notes (Signed)
   I, Philbert Riser, LAT, ATC acting as a scribe for Clementeen Graham, MD.  Donnie Aho is a 46 y.o. female who presents to [facility] at [facility] today for f/u R Achilles tendonopathy. Pt was last seen by Dr. Denyse Amass on 07/22/22 and was prescribed nitro patches and taught eccentric HEP, progressing from theraband to weight bearing. Today, pt reports R Achilles is feeling much better. Pt has been taking naproxen and wearing the nitro patches. Pt has been avoiding wearing heavier shoes.   Pertinent review of systems: No fevers or chills  Relevant historical information: Gestational diabetes.  Pregnancy-induced hypertension.   Exam:  BP (!) 156/88   Pulse 74   Ht 5\' 8"  (1.727 m)   Wt 277 lb 9.6 oz (125.9 kg)   SpO2 95%   BMI 42.21 kg/m  General: Well Developed, well nourished, and in no acute distress.   MSK: Right ankle: Mild swelling laterally otherwise normal. Tender palpation posterior ankle and Achilles tendon. Normal foot and ankle motion.    Lab and Radiology Results  Diagnostic Limited MSK Ultrasound of: Right Achilles tendon and lateral ankle Achilles tendon is intact with calcific change distal tendon consistent with chronic calcific Achilles tendinitis Lateral ankle small hypoechoic fluid collection superficial to the lateral malleolus and anterior lateral ankle not connected with an ankle effusion. No significant ankle effusion present at lateral ankle. Normal-appearing peroneal tendons. Impression: Chronic calcific Achilles tendinitis and soft tissue swelling superficial to the lateral malleolus.      Assessment and Plan: 46 y.o. female with ankle pain predominantly due to Achilles tendinitis.  Improving with nitroglycerin patches and oral naproxen.  Transition to weightbearing eccentric exercises.  She reviewed how to do those in clinic today and she felt that they were helpful.  Plan to continue these exercises and check back in 2  months.   PDMP not reviewed this encounter. Orders Placed This Encounter  Procedures   54 LIMITED JOINT SPACE STRUCTURES LOW RIGHT(NO LINKED CHARGES)    Order Specific Question:   Reason for Exam (SYMPTOM  OR DIAGNOSIS REQUIRED)    Answer:   eval AT    Order Specific Question:   Preferred imaging location?    Answer:   [facility]   No orders of the defined types were placed in this encounter.    Discussed warning signs or symptoms. Please see discharge instructions. Patient expresses understanding.   The above documentation has been reviewed and is accurate and complete US, M.D.

## 2022-08-27 NOTE — Patient Instructions (Addendum)
Thank you for coming in today.   Work on the weight bearing heel exercises.   Continue the nitroglycerine patches.   Recheck in 2 months.   I can add physical therapy if needed.

## 2022-09-23 ENCOUNTER — Encounter: Payer: Self-pay | Admitting: *Deleted

## 2022-10-01 ENCOUNTER — Telehealth: Payer: Self-pay

## 2022-10-01 NOTE — Telephone Encounter (Signed)
[name] (KeyGeanie Cooley) Rx #: [id number] EEFEOF 0.25MG /0.5ML auto-injectors  Your prior authorization for Mancel Parsons has been approved!  Personalized support and financial assistance may be available through the [facility] staff to advise and still on back order, was advised they were going to contact patient as well.

## 2022-10-10 ENCOUNTER — Other Ambulatory Visit: Payer: Self-pay | Admitting: Physician Assistant

## 2022-10-22 ENCOUNTER — Encounter: Payer: Self-pay | Admitting: Internal Medicine

## 2022-10-22 ENCOUNTER — Ambulatory Visit (INDEPENDENT_AMBULATORY_CARE_PROVIDER_SITE_OTHER): Payer: BC Managed Care – PPO | Admitting: Internal Medicine

## 2022-10-22 VITALS — BP 152/83 | HR 81 | Temp 98.3°F | Resp 14 | Ht 68.0 in | Wt 273.2 lb

## 2022-10-22 DIAGNOSIS — U071 COVID-19: Secondary | ICD-10-CM | POA: Diagnosis not present

## 2022-10-22 DIAGNOSIS — R051 Acute cough: Secondary | ICD-10-CM

## 2022-10-22 DIAGNOSIS — R0981 Nasal congestion: Secondary | ICD-10-CM

## 2022-10-22 LAB — POC COVID19 BINAXNOW: SARS Coronavirus 2 Ag: POSITIVE — AB

## 2022-10-22 MED ORDER — NIRMATRELVIR/RITONAVIR (PAXLOVID)TABLET: 3.0000 | ORAL_TABLET | Freq: Two times a day (BID) | ORAL | 0 refills | Status: DC

## 2022-10-22 NOTE — Progress Notes (Signed)
[place] at [facility]:  [phone]   Routine Medical Office Visit  Patient:  Katherine Hood      Age: 46 y.o.       Sex:  female  Date:   10/22/2022  PCP:    Fredirick Lathe, PA-C    Today's Healthcare Provider: Loralee Pacas, MD  Assessment/Plan:   Katherine Hood was seen today for nasal congestion, cough and hoarse.  COVID -     nirmatrelvir/ritonavir EUA; Take 3 tablets by mouth 2 (two) times daily for 5 days. (Take nirmatrelvir 150 mg two tablets twice daily for 5 days and ritonavir 100 mg one tablet twice daily for 5 days) Patient GFR is over 60  Dispense: 30 tablet; Refill: 0  Acute cough -     POC COVID-19 BinaxNow  Nasal congestion -     POC COVID-19 BinaxNow    Advised sinus rinses, otc comfort meds, paxlovid, and wrote off work note.  Today's key discussion points - also in After Visit Summary (AVS) Medication list was reconciled and patient instructions and summary information was documented and made available for her to review in the AVS (see AVS).  This note is also available to patient for review for accuracy and understanding. She was encouraged to contact our office by phone or message via MyChart if she has any questions or concerns regarding our treatment plan (see AVS).  No barriers to understanding were identified    Subjective:   Katherine Hood is a 46 y.o. female with PMH significant for: Past Medical History:  Diagnosis Date   Anxiety    Depression    Gestational diabetes    Gestational diabetes mellitus 08/23/2015   Gestational diabetes mellitus, antepartum    Hx of varicella    Hyperprolactinemia ([place])    Infertility, female    Medical history non-contributory    PCOS (polycystic ovarian syndrome)    Postpartum care following cesarean delivery (9/24) 09/23/2015     She is presenting today with: Chief Complaint  Patient presents with   Nasal Congestion    Since last night.   Cough    Producing mucus since last  night. Took COVID test, was positive but wasn't sure if it was a false positive because they were expired.   Hoarse    Started yesterday, lost voice.     Additional physician collected history: See Assessment/Plan section for per problem updates to history (overview and a/p subsections) as reported by patient today. Sore throat is not really just a littl etickle Lot of mucous Denies fevers Denies shortness of breath Dont know if it was chest pain but I'll just say yeeh Denies heart racing or skipping beat Out of sudafed and trying to avoid due to BP.          Objective:  Physical Exam: BP (!) 152/83 (BP Location: Left Arm, Patient Position: Sitting)   Pulse 81   Temp 98.3 F (36.8 C) (Temporal)   Resp 14   Ht 5\' 8"  (1.727 m)   Wt 273 lb 3.2 oz (123.9 kg)   SpO2 100%   BMI 41.54 kg/m   She is a polite, friendly, and genuine person Constitutional: NAD, AAO, not ill-appearing no increased work of breathing Neuro: alert, no focal deficit obvious, articulate speech Psych: normal mood, behavior, thought content   Problem specific physical exam findings:  Wearing mask, no coughing noted  No images are attached to the encounter or orders placed in the encounter.  Results:  Results for orders placed or performed in visit on 10/22/22  POC COVID-19  Result Value Ref Range   SARS Coronavirus 2 Ag Positive (A) Negative     Recent Results (from the past 2160 hour(s))  POC COVID-19     Status: Abnormal   Collection Time: 10/22/22  9:37 AM  Result Value Ref Range   SARS Coronavirus 2 Ag Positive (A) Negative

## 2022-10-25 ENCOUNTER — Encounter: Payer: Self-pay | Admitting: Family Medicine

## 2022-10-25 ENCOUNTER — Telehealth (INDEPENDENT_AMBULATORY_CARE_PROVIDER_SITE_OTHER): Payer: BC Managed Care – PPO | Admitting: Family Medicine

## 2022-10-25 DIAGNOSIS — U071 COVID-19: Secondary | ICD-10-CM | POA: Diagnosis not present

## 2022-10-25 DIAGNOSIS — T7840XA Allergy, unspecified, initial encounter: Secondary | ICD-10-CM | POA: Diagnosis not present

## 2022-10-25 MED ORDER — PREDNISONE 20 MG PO TABS: 40.0000 mg | ORAL_TABLET | Freq: Every day | ORAL | 0 refills | Status: AC

## 2022-10-25 NOTE — Progress Notes (Signed)
MyChart Video Visit    Virtual Visit via Video Note   This visit type was conducted due to national recommendations for restrictions regarding the COVID-19 Pandemic (e.g. social distancing) in an effort to limit this patient's exposure and mitigate transmission in our community. This patient is at least at moderate risk for complications without adequate follow up. This format is felt to be most appropriate for this patient at this time. Physical exam was limited by quality of the video and audio technology used for the visit. CMA was able to get the patient set up on a video visit.  Patient location: Home. Patient and provider in visit Provider location: Office  I discussed the limitations of evaluation and management by telemedicine and the availability of in person appointments. The patient expressed understanding and agreed to proceed.  Visit Date: 10/25/2022  Today's healthcare provider: Wellington Hampshire, MD     Subjective:    Patient ID: Katherine Hood, female    DOB: 01-20-1976, 46 y.o.   MRN: [id number]  Chief Complaint  Patient presents with   Medication Reaction    Was given Paxlovid on Tuesday for Covid positive, has stopped taking due to it making her itch Took Benadryl for the itching, which has helped     HPI Started paxlovid  yest, some itching and few red dots on dorsum of hands.  Took benadryl.  Some rash on neck.  All resolved.  Took paxlovid again last pm, and itching all over started again.  No rash.  No tongue swelling nor sob.  No more doses since.  Benadryl helping.   Still congested from covid but no cough/sob/f/c.  Not worse.    Past Medical History:  Diagnosis Date   Anxiety    Depression    Gestational diabetes    Gestational diabetes mellitus 08/23/2015   Gestational diabetes mellitus, antepartum    Hx of varicella    Hyperprolactinemia ([place])    Infertility, female    Medical history non-contributory    PCOS (polycystic ovarian syndrome)     Postpartum care following cesarean delivery (9/24) 09/23/2015    Past Surgical History:  Procedure Laterality Date   CESAREAN SECTION N/A 09/23/2015   Procedure: CESAREAN SECTION;  Surgeon: Servando Salina, MD;  Location: [facility];  Service: Obstetrics;  Laterality: N/A;   HERNIA REPAIR      Outpatient Medications Prior to Visit  Medication Sig Dispense Refill   albuterol (VENTOLIN HFA) 108 (90 Base) MCG/ACT inhaler Inhale 2 puffs into the lungs every 6 (six) hours as needed for wheezing or shortness of breath. 8 g 2   cetirizine (ZYRTEC ALLERGY) 10 MG tablet Take 1 tablet (10 mg total) by mouth daily. 30 tablet 0   fluticasone (FLONASE) 50 MCG/ACT nasal spray Place 1 spray into both nostrils daily. 16 g 2   ibuprofen (ADVIL) 600 MG tablet Take 1 tablet (600 mg total) by mouth every 6 (six) hours as needed. 30 tablet 0   loratadine (CLARITIN) 10 MG tablet Take 10 mg by mouth daily.     metFORMIN (GLUCOPHAGE-XR) 500 MG 24 hr tablet TAKE 1 TABLET BY MOUTH EVERY DAY WITH BREAKFAST 90 tablet 1   methocarbamol (ROBAXIN) 500 MG tablet Take 1 tablet (500 mg total) by mouth 2 (two) times daily as needed for muscle spasms. 20 tablet 0   nitroGLYCERIN (NITRODUR - DOSED IN MG/24 HR) 0.2 mg/hr patch Place 1/4 to 1/2 of a patch over affected region. Remove and replace once  daily.  Slightly alter skin placement daily 30 patch 1   pseudoephedrine (SUDAFED) 60 MG tablet Take 1 tablet (60 mg total) by mouth every 8 (eight) hours as needed for congestion. 30 tablet 0   Semaglutide-Weight Management 0.25 MG/0.5ML SOAJ Inject 0.25 mg into the skin once a week. 2 mL 0   amLODipine (NORVASC) 5 MG tablet Take 1 tablet (5 mg total) by mouth daily. 90 tablet 1   nirmatrelvir/ritonavir EUA (PAXLOVID) 20 x 150 MG & 10 x 100MG  TABS Take 3 tablets by mouth 2 (two) times daily for 5 days. (Take nirmatrelvir 150 mg two tablets twice daily for 5 days and ritonavir 100 mg one tablet twice daily for 5 days) Patient GFR is over  60 (Patient not taking: Reported on 10/25/2022) 30 tablet 0   No facility-administered medications prior to visit.    Allergies  Allergen Reactions   Latex    Paxlovid [Nirmatrelvir-Ritonavir] Itching and Rash        Objective:     Physical Exam  Vitals and nursing note reviewed.  Constitutional:      General:  is not in acute distress.    Appearance: Normal appearance.  HENT:     Head: Normocephalic. congested Pulmonary:     Effort: No respiratory distress.  Skin:    General: Skin is dry.     Coloration: Skin is not pale.  Neurological:     Mental Status: Pt is alert and oriented to person, place, and time.  Psychiatric:        Mood and Affect: Mood normal.   There were no vitals taken for this visit.  Wt Readings from Last 3 Encounters:  10/22/22 273 lb 3.2 oz (123.9 kg)  08/27/22 277 lb 9.6 oz (125.9 kg)  07/22/22 272 lb 12.8 oz (123.7 kg)       Assessment & Plan:   Problem List Items Addressed This Visit   None Visit Diagnoses     COVID-19    -  Primary   Allergic reaction to drug, initial encounter          Allergic rxn to paxlovid-stop med.   Ok to do benadryl.  Prednisone 40mg  sent to pharm-on hold but avail to take if rash, itch worse, etc.  (Man not need) Covid-not worse.  Symptomatic tx.  No paxlovid.  Meds ordered this encounter  Medications   predniSONE (DELTASONE) 20 MG tablet    Sig: Take 2 tablets (40 mg total) by mouth daily with breakfast for 5 days.    Dispense:  10 tablet    Refill:  0    I discussed the assessment and treatment plan with the patient. The patient was provided an opportunity to ask questions and all were answered. The patient agreed with the plan and demonstrated an understanding of the instructions.   The patient was advised to call back or seek an in-person evaluation if the symptoms worsen or if the condition fails to improve as anticipated.  I provided 10 minutes of face-to-face time during this  encounter.   07/24/22, MD [facility] [phone] (phone) [phone] (fax)  [facility]

## 2022-10-25 NOTE — Patient Instructions (Signed)
No more paxlovid.  Can continue benadryl  Sending in prednisone-take if getting worse, rash/itching.
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
## 2022-10-29 ENCOUNTER — Telehealth: Payer: BC Managed Care – PPO | Admitting: Physician Assistant

## 2022-11-16 ENCOUNTER — Other Ambulatory Visit: Payer: Self-pay | Admitting: Family Medicine

## 2022-11-18 ENCOUNTER — Ambulatory Visit (INDEPENDENT_AMBULATORY_CARE_PROVIDER_SITE_OTHER): Payer: BC Managed Care – PPO | Admitting: Family Medicine

## 2022-11-18 ENCOUNTER — Encounter: Payer: Self-pay | Admitting: Family Medicine

## 2022-11-18 VITALS — BP 156/84 | HR 85 | Temp 97.6°F | Ht 68.0 in | Wt 275.0 lb

## 2022-11-18 DIAGNOSIS — J4521 Mild intermittent asthma with (acute) exacerbation: Secondary | ICD-10-CM

## 2022-11-18 DIAGNOSIS — B349 Viral infection, unspecified: Secondary | ICD-10-CM | POA: Insufficient documentation

## 2022-11-18 DIAGNOSIS — J45909 Unspecified asthma, uncomplicated: Secondary | ICD-10-CM | POA: Insufficient documentation

## 2022-11-18 MED ORDER — DM-GUAIFENESIN ER 30-600 MG PO TB12: 1.0000 | ORAL_TABLET | Freq: Two times a day (BID) | ORAL | 0 refills | Status: DC | PRN

## 2022-11-18 MED ORDER — PREDNISONE 20 MG PO TABS: 20.0000 mg | ORAL_TABLET | Freq: Two times a day (BID) | ORAL | 0 refills | Status: AC

## 2022-11-18 MED ORDER — ALBUTEROL SULFATE HFA 108 (90 BASE) MCG/ACT IN AERS: 1.0000 | INHALATION_SPRAY | Freq: Four times a day (QID) | RESPIRATORY_TRACT | 2 refills | Status: DC | PRN

## 2022-11-18 MED ORDER — BENZONATATE 200 MG PO CAPS: 200.0000 mg | ORAL_CAPSULE | Freq: Two times a day (BID) | ORAL | 0 refills | Status: DC | PRN

## 2022-11-18 NOTE — Progress Notes (Signed)
New Patient Office Visit  Subjective    Patient ID: Katherine Hood, female    DOB: [birth year]  Age: 46 y.o. MRN: [id number]  CC:  Chief Complaint  Patient presents with   Cough    Cough, body aches lots of mucus x 2 days.     HPI Katherine Hood presents to establish care Presents with a 2-day history of headache, elevated temperature, postnasal drip, cough with wheezing, myalgias and arthralgias.  Denies sore throat nausea or vomiting.  History of reactive airway disease with illness.  No personal history of asthma.  Strong family history of asthma.  She does not smoke.  Her blood pressure normally runs in the 130s over 80s.  Outpatient Encounter Medications as of 11/18/2022  Medication Sig   albuterol (VENTOLIN HFA) 108 (90 Base) MCG/ACT inhaler Inhale 1-2 puffs into the lungs every 6 (six) hours as needed for wheezing or shortness of breath.   amLODipine (NORVASC) 5 MG tablet Take 1 tablet (5 mg total) by mouth daily.   benzonatate (TESSALON) 200 MG capsule Take 1 capsule (200 mg total) by mouth 2 (two) times daily as needed for cough.   cetirizine (ZYRTEC ALLERGY) 10 MG tablet Take 1 tablet (10 mg total) by mouth daily.   dextromethorphan-guaiFENesin (MUCINEX DM) 30-600 MG 12hr tablet Take 1 tablet by mouth 2 (two) times daily as needed for cough.   fluticasone (FLONASE) 50 MCG/ACT nasal spray Place 1 spray into both nostrils daily.   ibuprofen (ADVIL) 600 MG tablet Take 1 tablet (600 mg total) by mouth every 6 (six) hours as needed.   loratadine (CLARITIN) 10 MG tablet Take 10 mg by mouth daily.   metFORMIN (GLUCOPHAGE-XR) 500 MG 24 hr tablet TAKE 1 TABLET BY MOUTH EVERY DAY WITH BREAKFAST   methocarbamol (ROBAXIN) 500 MG tablet Take 1 tablet (500 mg total) by mouth 2 (two) times daily as needed for muscle spasms.   nitroGLYCERIN (NITRODUR - DOSED IN MG/24 HR) 0.2 mg/hr patch Place 1/4 to 1/2 of a patch over affected region. Remove and replace once daily.  Slightly alter skin  placement daily   predniSONE (DELTASONE) 20 MG tablet Take 1 tablet (20 mg total) by mouth 2 (two) times daily with a meal for 7 days.   pseudoephedrine (SUDAFED) 60 MG tablet Take 1 tablet (60 mg total) by mouth every 8 (eight) hours as needed for congestion.   [DISCONTINUED] albuterol (VENTOLIN HFA) 108 (90 Base) MCG/ACT inhaler Inhale 2 puffs into the lungs every 6 (six) hours as needed for wheezing or shortness of breath.   Semaglutide-Weight Management 0.25 MG/0.5ML SOAJ Inject 0.25 mg into the skin once a week. (Patient not taking: Reported on 11/18/2022)   [DISCONTINUED] triamcinolone (NASACORT) 55 MCG/ACT AERO nasal inhaler Place 2 sprays into the nose daily.   No facility-administered encounter medications on file as of 11/18/2022.    Past Medical History:  Diagnosis Date   Anxiety    Depression    Gestational diabetes    Gestational diabetes mellitus 08/23/2015   Gestational diabetes mellitus, antepartum    Hx of varicella    Hyperprolactinemia ([facility])    Infertility, female    Medical history non-contributory    PCOS (polycystic ovarian syndrome)    Postpartum care following cesarean delivery (9/24) 09/23/2015    Past Surgical History:  Procedure Laterality Date   CESAREAN SECTION N/A 09/23/2015   Procedure: CESAREAN SECTION;  Surgeon: Maxie Better, MD;  Location: [facility];  Service: Obstetrics;  Laterality: N/A;  HERNIA REPAIR      Family History  Problem Relation Age of Onset   Diabetes Mother    Cancer Mother    Heart disease Mother    Depression Mother    Diabetes Father    Endometriosis Sister    Depression Sister    Hypertension Sister    Diabetes Sister    Hypertension Maternal Grandmother     Social History   Socioeconomic History   Marital status: Married    Spouse name: Not on file   Number of children: Not on file   Years of education: Not on file   Highest education level: Not on file  Occupational History   Not on file  Tobacco Use    Smoking status: Never   Smokeless tobacco: Never  Vaping Use   Vaping Use: Never used  Substance and Sexual Activity   Alcohol use: No   Drug use: No   Sexual activity: Yes  Other Topics Concern   Not on file  Social History Narrative   Not on file   Social Determinants of Health   Financial Resource Strain: Not on file  Food Insecurity: Not on file  Transportation Needs: Not on file  Physical Activity: Not on file  Stress: Not on file  Social Connections: Not on file  Intimate Partner Violence: Not on file    Review of Systems  Constitutional:  Positive for malaise/fatigue.  HENT:  Positive for congestion.   Eyes:  Negative for blurred vision, discharge and redness.  Respiratory:  Positive for cough and wheezing. Negative for shortness of breath.   Cardiovascular: Negative.   Gastrointestinal:  Negative for abdominal pain.  Genitourinary: Negative.   Musculoskeletal:  Positive for joint pain and myalgias.  Skin:  Negative for rash.  Neurological:  Positive for headaches. Negative for tingling, loss of consciousness and weakness.  Endo/Heme/Allergies:  Negative for polydipsia.        Objective    BP (!) 156/84 (BP Location: Right Arm, Patient Position: Sitting, Cuff Size: Large)   Pulse 85   Temp 97.6 F (36.4 C) (Temporal)   Ht 5\' 8"  (1.727 m)   Wt 275 lb (124.7 kg)   SpO2 98%   BMI 41.81 kg/m   Physical Exam Constitutional:      General: She is not in acute distress.    Appearance: Normal appearance. She is not ill-appearing, toxic-appearing or diaphoretic.  HENT:     Head: Normocephalic and atraumatic.     Right Ear: Tympanic membrane, ear canal and external ear normal.     Left Ear: Tympanic membrane, ear canal and external ear normal.     Mouth/Throat:     Mouth: Mucous membranes are moist.     Pharynx: Oropharynx is clear. No oropharyngeal exudate or posterior oropharyngeal erythema.  Eyes:     General: No scleral icterus.       Right eye: No  discharge.        Left eye: No discharge.     Extraocular Movements: Extraocular movements intact.     Conjunctiva/sclera: Conjunctivae normal.     Pupils: Pupils are equal, round, and reactive to light.  Cardiovascular:     Rate and Rhythm: Normal rate and regular rhythm.  Pulmonary:     Effort: Pulmonary effort is normal. No respiratory distress.     Breath sounds: Normal breath sounds. No wheezing or rales.  Abdominal:     General: Bowel sounds are normal.     Tenderness: There  is no abdominal tenderness. There is no guarding.  Musculoskeletal:     Cervical back: No rigidity or tenderness.  Lymphadenopathy:     Cervical: No cervical adenopathy.  Skin:    General: Skin is warm and dry.  Neurological:     Mental Status: She is alert and oriented to person, place, and time.  Psychiatric:        Mood and Affect: Mood normal.        Behavior: Behavior normal.         Assessment & Plan:   Problem List Items Addressed This Visit       Respiratory   Reactive airway disease   Relevant Medications   albuterol (VENTOLIN HFA) 108 (90 Base) MCG/ACT inhaler   predniSONE (DELTASONE) 20 MG tablet     Other   Viral syndrome - Primary   Relevant Medications   benzonatate (TESSALON) 200 MG capsule   dextromethorphan-guaiFENesin (MUCINEX DM) 30-600 MG 12hr tablet   Other Relevant Orders   COVID-19, Flu A+B and RSV    Return in about 1 week (around 11/25/2022), or if symptoms worsen or fail to improve.  Information was given on viral syndrome.  Libby Maw, MD

## 2022-11-19 NOTE — Telephone Encounter (Signed)
Rx refill request approved per Dr. Corey's orders. 

## 2022-11-20 LAB — COVID-19, FLU A+B AND RSV
Influenza A, NAA: DETECTED — AB
Influenza B, NAA: NOT DETECTED
RSV, NAA: NOT DETECTED
SARS-CoV-2, NAA: NOT DETECTED

## 2023-05-19 ENCOUNTER — Ambulatory Visit (INDEPENDENT_AMBULATORY_CARE_PROVIDER_SITE_OTHER): Payer: BC Managed Care – PPO | Admitting: Physician Assistant

## 2023-05-19 ENCOUNTER — Encounter: Payer: Self-pay | Admitting: Physician Assistant

## 2023-05-19 VITALS — BP 143/78 | HR 75 | Temp 98.0°F | Ht 68.0 in | Wt 280.6 lb

## 2023-05-19 DIAGNOSIS — E282 Polycystic ovarian syndrome: Secondary | ICD-10-CM | POA: Diagnosis not present

## 2023-05-19 DIAGNOSIS — R7303 Prediabetes: Secondary | ICD-10-CM

## 2023-05-19 DIAGNOSIS — I10 Essential (primary) hypertension: Secondary | ICD-10-CM

## 2023-05-19 LAB — POCT GLYCOSYLATED HEMOGLOBIN (HGB A1C): Hemoglobin A1C: 5.6 % (ref 4.0–5.6)

## 2023-05-19 MED ORDER — CONTRAVE 8-90 MG PO TB12: ORAL_TABLET | ORAL | 0 refills | Status: DC

## 2023-05-19 MED ORDER — AMLODIPINE BESYLATE 5 MG PO TABS: 5.0000 mg | ORAL_TABLET | Freq: Every day | ORAL | 1 refills | Status: DC

## 2023-05-19 NOTE — Assessment & Plan Note (Signed)
Limited exercise due to knee and tendon issues. Working on dietary habits. Would like to try Contrave to help with appetite suppression.  Pt aware of risks vs benefits and possible adverse reactions. Will see about insurance coverage.  Pt to continue lifestyle measures.  Working with counseling at this time as well.

## 2023-05-19 NOTE — Assessment & Plan Note (Signed)
Did not tolerate metformin

## 2023-05-19 NOTE — Assessment & Plan Note (Signed)
Lab Results  Component Value Date   HGBA1C 5.6 05/19/2023   HGBA1C 5.7 (H) 07/10/2022   HGBA1C 5.9 (H) 01/30/2022   Congratulated her on keeping A1c down, not diabetic.

## 2023-05-19 NOTE — Patient Instructions (Signed)
Good to see you again today!  Start on Contrave if [employer] approves this. Monitor blood pressure / side effect potential. Call if any issues!   Also ask insurance about [employer] or Zepbound coverage.  Keep up good work!

## 2023-05-19 NOTE — Assessment & Plan Note (Signed)
BP Readings from Last 3 Encounters:  05/19/23 (!) 143/78  11/18/22 (!) 156/84  10/22/22 (!) 152/83   BP elevated today, she didn't take her amlodipine yet. States it has been running in normal readings at home. Continue amlodipine 5 mg. Monitor at home. Keep working on lifestyle.

## 2023-05-19 NOTE — Progress Notes (Signed)
Subjective:    Patient ID: Katherine Hood, female    DOB: [birth year], 47 y.o.   MRN: [id number]  Chief Complaint  Patient presents with   Medical Management of Chronic Issues    Pt in office to f/u on BP check and weight loss; pt denies monitoring bp, pt c/o fatique; checking A1C today; pt was having adverse effects to Metformin and stopped using medication for past 2 months. Pt was never able to get Semaglutide never in stock; pt not took bp meds since last week and has trouble remembering to take it;     HPI Patient is in today for follow up on prediabetes, HTN, weight concerns. See A/P.   Past Medical History:  Diagnosis Date   Anxiety    Depression    Gestational diabetes    Gestational diabetes mellitus 08/23/2015   Gestational diabetes mellitus, antepartum    Hx of varicella    Hyperprolactinemia ([facility])    Infertility, female    Medical history non-contributory    PCOS (polycystic ovarian syndrome)    PIH (pregnancy induced hypertension) 09/24/2015   Postpartum care following cesarean delivery (9/24) 09/23/2015    Past Surgical History:  Procedure Laterality Date   CESAREAN SECTION N/A 09/23/2015   Procedure: CESAREAN SECTION;  Surgeon: Maxie Better, MD;  Location: [facility];  Service: Obstetrics;  Laterality: N/A;   HERNIA REPAIR      Family History  Problem Relation Age of Onset   Diabetes Mother    Cancer Mother    Heart disease Mother    Depression Mother    Diabetes Father    Endometriosis Sister    Depression Sister    Hypertension Sister    Diabetes Sister    Hypertension Maternal Grandmother     Social History   Tobacco Use   Smoking status: Never   Smokeless tobacco: Never  Vaping Use   Vaping Use: Never used  Substance Use Topics   Alcohol use: No   Drug use: No     Allergies  Allergen Reactions   Latex    Metformin And Related Diarrhea    Pt had severe bloating and cramping with diarrhea    Paxlovid [Nirmatrelvir-Ritonavir]  Itching and Rash    Review of Systems NEGATIVE UNLESS OTHERWISE INDICATED IN HPI      Objective:     BP (!) 143/78 (BP Location: Left Arm, Patient Position: Sitting)   Pulse 75   Temp 98 F (36.7 C) (Temporal)   Ht 5\' 8"  (1.727 m)   Wt 280 lb 9.6 oz (127.3 kg)   SpO2 98%   BMI 42.67 kg/m   Wt Readings from Last 3 Encounters:  05/19/23 280 lb 9.6 oz (127.3 kg)  11/18/22 275 lb (124.7 kg)  10/22/22 273 lb 3.2 oz (123.9 kg)    BP Readings from Last 3 Encounters:  05/19/23 (!) 143/78  11/18/22 (!) 156/84  10/22/22 (!) 152/83     Physical Exam Vitals and nursing note reviewed.  Constitutional:      Appearance: Normal appearance. She is obese.  Eyes:     Extraocular Movements: Extraocular movements intact.     Conjunctiva/sclera: Conjunctivae normal.     Pupils: Pupils are equal, round, and reactive to light.  Cardiovascular:     Rate and Rhythm: Normal rate and regular rhythm.     Pulses: Normal pulses.     Heart sounds: Normal heart sounds. No murmur heard. Pulmonary:     Effort: Pulmonary effort is  normal.     Breath sounds: Normal breath sounds.  Neurological:     General: No focal deficit present.     Mental Status: She is alert and oriented to person, place, and time.  Psychiatric:        Mood and Affect: Mood normal.        Assessment & Plan:  Obesity, Class III, BMI 40-49.9 (morbid obesity) (HCC) Assessment & Plan: Limited exercise due to knee and tendon issues. Working on dietary habits. Would like to try Contrave to help with appetite suppression.  Pt aware of risks vs benefits and possible adverse reactions. Will see about insurance coverage.  Pt to continue lifestyle measures.  Working with counseling at this time as well.    Orders: -     Contrave; Start 1 tablet every morning for 7 days, then 1 tablet twice daily for 7 days, then 2 tablets every morning and one every evening  Dispense: 42 tablet; Refill: 0  Essential  hypertension Assessment & Plan: BP Readings from Last 3 Encounters:  05/19/23 (!) 143/78  11/18/22 (!) 156/84  10/22/22 (!) 152/83   BP elevated today, she didn't take her amlodipine yet. States it has been running in normal readings at home. Continue amlodipine 5 mg. Monitor at home. Keep working on lifestyle.  Orders: -     amLODIPine Besylate; Take 1 tablet (5 mg total) by mouth daily.  Dispense: 90 tablet; Refill: 1  Prediabetes Assessment & Plan: Lab Results  Component Value Date   HGBA1C 5.6 05/19/2023   HGBA1C 5.7 (H) 07/10/2022   HGBA1C 5.9 (H) 01/30/2022   Congratulated her on keeping A1c down, not diabetic.   Orders: -     POCT glycosylated hemoglobin (Hb A1C)  PCOS (polycystic ovarian syndrome) Assessment & Plan: Did not tolerate metformin         Return in about 3 months (around 08/19/2023) for physical, fasting labs .     Garlen Reinig M Karsynn Deweese, PA-C

## 2023-05-28 ENCOUNTER — Other Ambulatory Visit: Payer: Self-pay

## 2023-05-28 ENCOUNTER — Telehealth: Payer: Self-pay

## 2023-05-28 ENCOUNTER — Other Ambulatory Visit (HOSPITAL_COMMUNITY): Payer: Self-pay

## 2023-05-28 MED ORDER — WEGOVY 0.25 MG/0.5ML ~~LOC~~ SOAJ: 0.2500 mg | SUBCUTANEOUS | 0 refills | Status: DC

## 2023-05-28 NOTE — Telephone Encounter (Signed)
Please advise on Prior auth for Contrave or [facility] for patient. Thanks

## 2023-05-28 NOTE — Telephone Encounter (Signed)
Clinical criteria for Contrave- Team please start PA for [facility]:

## 2023-05-28 NOTE — Telephone Encounter (Signed)
Pharmacy Patient Advocate Encounter   Received notification that prior authorization for [facility] is required/requested.   PA submitted on 05/28/23 to (ins) [facility] via CoverMyMeds Key # [id number]  Waiting on questions to populate

## 2023-05-29 ENCOUNTER — Other Ambulatory Visit (HOSPITAL_COMMUNITY): Payer: Self-pay

## 2023-05-29 NOTE — Telephone Encounter (Signed)
Pharmacy Patient Advocate Encounter  Received notification from [employer] that the request for prior authorization for [facility] has been closed due to the prescription is covered only when prescribed by a [facility] provider. Patient to contact [employer] at [phone].Marland Kitchen

## 2023-05-29 NOTE — Telephone Encounter (Signed)
Patient has been notified of information via MyChart message.

## 2023-07-30 ENCOUNTER — Encounter (INDEPENDENT_AMBULATORY_CARE_PROVIDER_SITE_OTHER): Payer: Self-pay

## 2023-08-28 ENCOUNTER — Encounter: Payer: BC Managed Care – PPO | Admitting: Physician Assistant

## 2023-09-08 ENCOUNTER — Telehealth: Payer: Self-pay | Admitting: Physician Assistant

## 2023-09-08 ENCOUNTER — Telehealth (INDEPENDENT_AMBULATORY_CARE_PROVIDER_SITE_OTHER): Payer: BC Managed Care – PPO | Admitting: Physician Assistant

## 2023-09-08 ENCOUNTER — Encounter: Payer: Self-pay | Admitting: Physician Assistant

## 2023-09-08 VITALS — Temp 100.0°F | Ht 68.0 in | Wt 280.0 lb

## 2023-09-08 DIAGNOSIS — U071 COVID-19: Secondary | ICD-10-CM | POA: Diagnosis not present

## 2023-09-08 MED ORDER — MOLNUPIRAVIR EUA 200MG CAPSULE: 4.0000 | ORAL_CAPSULE | Freq: Two times a day (BID) | ORAL | 0 refills | Status: AC

## 2023-09-08 NOTE — Progress Notes (Signed)
   Virtual Visit via Video Note  I connected with  Katherine Hood  on 09/08/23 at 11:15 AM EDT by a video enabled telemedicine application and verified that I am speaking with the correct person using two identifiers.  Location: Patient: home Provider: Nature conservation officer at [employer] Persons present: Patient and myself   I discussed the limitations of evaluation and management by telemedicine and the availability of in person appointments. The patient expressed understanding and agreed to proceed.   History of Present Illness:  Chief complaint: COVID-19 positive Symptom onset: 09/05/23 Pertinent positives: Fever (tmax 102 F), fatigue, congestion, head pressure Pertinent negatives: chest pressure this morning, but has since resolved, no SOB, no cough; no GI symptoms  Treatments tried: neti pot, nasal spray, aspirin  Vaccine status: not UTD with latest COVID vaccine Sick exposure: son's schoolmates have had COVID    Observations/Objective:  Temp: 100 F  Gen: Awake, alert, no acute distress, congested, laying in bed Resp: Breathing is even and non-labored Psych: calm/pleasant demeanor Neuro: Alert and Oriented x 3, + facial symmetry, speech is clear.   Assessment and Plan:  1. COVID-19 Diagnosis confirmed via home antigen test.   +morbid obesity, prediabetes, HTN - will Rx molnupiravir at this time (allergy to Paxlovid). Keep Benadryl on hand in case of rash like she had with Paxlovid & call if this occurs. Out of work note this week. Low threshold for ER if any red flag symptoms   Risks versus benefits discussed.  Advised self-isolation at home for the next 5 days and then masking around others for at least an additional 5 days.  Treat supportively at this time including sleeping prone, deep breathing exercises, pushing fluids, walking every few hours, vitamins C and D, and Tylenol or ibuprofen as needed.  The patient understands that COVID-19 illness can wax and  wane.  Should the symptoms acutely worsen or patient starts to experience sudden shortness of breath, chest pain, severe weakness, the patient will go straight to the emergency department.  Also advised home pulse oximetry monitoring and for any reading consistently under 92%, should also report to the emergency department.  The patient will continue to keep [place] updated.    Follow Up Instructions:    I discussed the assessment and treatment plan with the patient. The patient was provided an opportunity to ask questions and all were answered. The patient agreed with the plan and demonstrated an understanding of the instructions.   The patient was advised to call back or seek an in-person evaluation if the symptoms worsen or if the condition fails to improve as anticipated.  Finnick Orosz M Hadley Soileau, PA-C

## 2023-09-08 NOTE — Telephone Encounter (Signed)
Letter updated and patient was advised by phone this is available to her via MyChart, pt verbalized understanding

## 2023-09-08 NOTE — Telephone Encounter (Signed)
Patient called stating she got the letter provided by provider but it needs to state she was diagnosed with COVID so it can be covered by her FMLA. Can this letter be revised and re-sent?

## 2023-10-09 ENCOUNTER — Other Ambulatory Visit: Payer: Self-pay

## 2023-10-10 ENCOUNTER — Other Ambulatory Visit: Payer: Self-pay

## 2023-10-10 MED ORDER — SEMAGLUTIDE-WEIGHT MANAGEMENT 0.5 MG/0.5ML ~~LOC~~ SOAJ
0.5000 mg | SUBCUTANEOUS | 0 refills | Status: DC
  Filled 2023-10-10: qty 2, 28d supply, fill #0

## 2024-01-05 LAB — HM MAMMOGRAPHY

## 2024-01-08 ENCOUNTER — Encounter: Payer: Self-pay | Admitting: Physician Assistant

## 2024-01-08 ENCOUNTER — Ambulatory Visit (INDEPENDENT_AMBULATORY_CARE_PROVIDER_SITE_OTHER): Payer: BC Managed Care – PPO | Admitting: Physician Assistant

## 2024-01-08 VITALS — BP 140/83 | HR 75 | Temp 97.6°F | Ht 68.0 in | Wt 269.0 lb

## 2024-01-08 DIAGNOSIS — Z124 Encounter for screening for malignant neoplasm of cervix: Secondary | ICD-10-CM

## 2024-01-08 DIAGNOSIS — R7303 Prediabetes: Secondary | ICD-10-CM

## 2024-01-08 DIAGNOSIS — Z23 Encounter for immunization: Secondary | ICD-10-CM | POA: Diagnosis not present

## 2024-01-08 DIAGNOSIS — Z131 Encounter for screening for diabetes mellitus: Secondary | ICD-10-CM | POA: Diagnosis not present

## 2024-01-08 DIAGNOSIS — I10 Essential (primary) hypertension: Secondary | ICD-10-CM

## 2024-01-08 DIAGNOSIS — Z0000 Encounter for general adult medical examination without abnormal findings: Secondary | ICD-10-CM | POA: Diagnosis not present

## 2024-01-08 DIAGNOSIS — Z6841 Body Mass Index (BMI) 40.0 and over, adult: Secondary | ICD-10-CM

## 2024-01-08 DIAGNOSIS — Z13220 Encounter for screening for lipoid disorders: Secondary | ICD-10-CM | POA: Diagnosis not present

## 2024-01-08 LAB — LIPID PANEL
Cholesterol: 179 mg/dL (ref 0–200)
HDL: 38.9 mg/dL — ABNORMAL LOW (ref >39.00)
LDL Cholesterol: 125 mg/dL — ABNORMAL HIGH (ref 0–99)
NonHDL: 140.19
Total CHOL/HDL Ratio: 5
Triglycerides: 76 mg/dL (ref 0.0–149.0)
VLDL: 15.2 mg/dL (ref 0.0–40.0)

## 2024-01-08 LAB — CBC WITH DIFFERENTIAL/PLATELET
Basophils Absolute: 0 10*3/uL (ref 0.0–0.1)
Basophils Relative: 0.4 % (ref 0.0–3.0)
Eosinophils Absolute: 0.1 10*3/uL (ref 0.0–0.7)
Eosinophils Relative: 1.7 % (ref 0.0–5.0)
HCT: 39 % (ref 36.0–46.0)
Hemoglobin: 12.5 g/dL (ref 12.0–15.0)
Lymphocytes Relative: 42.3 % (ref 12.0–46.0)
Lymphs Abs: 3.4 10*3/uL (ref 0.7–4.0)
MCHC: 32 g/dL (ref 30.0–36.0)
MCV: 83.8 fL (ref 78.0–100.0)
Monocytes Absolute: 0.7 10*3/uL (ref 0.1–1.0)
Monocytes Relative: 8.7 % (ref 3.0–12.0)
Neutro Abs: 3.8 10*3/uL (ref 1.4–7.7)
Neutrophils Relative %: 46.9 % (ref 43.0–77.0)
Platelets: 434 10*3/uL — ABNORMAL HIGH (ref 150.0–400.0)
RBC: 4.66 Mil/uL (ref 3.87–5.11)
RDW: 15 % (ref 11.5–15.5)
WBC: 8.1 10*3/uL (ref 4.0–10.5)

## 2024-01-08 LAB — VITAMIN B12: Vitamin B-12: 506 pg/mL (ref 211–911)

## 2024-01-08 LAB — TSH: TSH: 3.21 u[IU]/mL (ref 0.35–5.50)

## 2024-01-08 LAB — COMPREHENSIVE METABOLIC PANEL
ALT: 7 U/L (ref 0–35)
AST: 13 U/L (ref 0–37)
Albumin: 4.1 g/dL (ref 3.5–5.2)
Alkaline Phosphatase: 87 U/L (ref 39–117)
BUN: 10 mg/dL (ref 6–23)
CO2: 23 meq/L (ref 19–32)
Calcium: 9.4 mg/dL (ref 8.4–10.5)
Chloride: 107 meq/L (ref 96–112)
Creatinine, Ser: 0.99 mg/dL (ref 0.40–1.20)
GFR: 68.32 mL/min (ref >60.00)
Glucose, Bld: 84 mg/dL (ref 70–99)
Potassium: 3.9 meq/L (ref 3.5–5.1)
Sodium: 138 meq/L (ref 135–145)
Total Bilirubin: 0.4 mg/dL (ref 0.2–1.2)
Total Protein: 7.6 g/dL (ref 6.0–8.3)

## 2024-01-08 LAB — VITAMIN D 25 HYDROXY (VIT D DEFICIENCY, FRACTURES): VITD: 33.39 ng/mL (ref 30.00–100.00)

## 2024-01-08 LAB — HEMOGLOBIN A1C: Hgb A1c MFr Bld: 5.7 % (ref 4.6–6.5)

## 2024-01-08 NOTE — Patient Instructions (Signed)
 VISIT SUMMARY:  Today, you came in for a routine physical examination. You mentioned that you are feeling well overall and have been actively managing your weight with Wegovy , which has led to noticeable weight loss and changes in your eating habits. You also reported experiencing some indigestion and nausea. Your blood pressure readings have been consistent, and you expressed concern about your kidney health due to family history. Additionally, you mentioned being due for eye and dental check-ups.  YOUR PLAN:  -HYPERTENSION: Hypertension means high blood pressure. Your blood pressure readings have been consistently in the 140s. You should continue taking Amlodipine  and monitor your blood pressure regularly. If your readings remain high, consider reaching out for a possible medication adjustment.  -WEIGHT MANAGEMENT/PCOS: You are managing your weight with Wegovy  and have experienced some side effects like nausea and indigestion. Continue taking Wegovy , maintain your dietary modifications, and exercise regularly. If your indigestion persists, we may consider prescribing medication for heartburn.  -PREVENTIVE CARE: You are up to date on your mammogram and Pap smear. Today, you received flu and tetanus vaccines. We will check your blood work, including kidney function, due to your family history of kidney disease. Please ensure you have regular eye and dental check-ups.  INSTRUCTIONS:  Please follow up in 6 months to monitor your progress and blood pressure control. Continue to monitor your blood pressure regularly and reach out if your readings remain high. Ensure you have your eye and dental check-ups as soon as possible.

## 2024-01-08 NOTE — Progress Notes (Signed)
 Patient ID: Katherine Hood, female    DOB: 1976-06-11, 48 y.o.   MRN: [id number]   Assessment & Plan:  Annual physical exam -     CBC with Differential/Platelet; Future -     Comprehensive metabolic panel; Future -     Hemoglobin A1c; Future -     Lipid panel; Future -     TSH; Future -     Vitamin B12; Future -     VITAMIN D  25 Hydroxy (Vit-D Deficiency, Fractures); Future  Prediabetes  Essential hypertension  Morbid obesity (HCC)  Need for influenza vaccination -     Flu vaccine trivalent PF, 6mos and older(Flulaval,Afluria,Fluarix,Fluzone)  Need for tetanus booster  Need for tetanus, diphtheria, and acellular pertussis (Tdap) vaccine -     Tdap vaccine greater than or equal to 7yo IM   Age-appropriate screening and counseling performed today. Will check labs and call with results. Preventive measures discussed and printed in AVS for patient.   Patient Counseling: [x]   Nutrition: Stressed importance of moderation in sodium/caffeine intake, saturated fat and cholesterol, caloric balance, sufficient intake of fresh fruits, vegetables, and fiber.  [x]   Stressed the importance of regular exercise.   []   Substance Abuse: Discussed cessation/primary prevention of tobacco, alcohol, or other drug use; driving or other dangerous activities under the influence; availability of treatment for abuse.   []   Injury prevention: Discussed safety belts, safety helmets, smoke detector, smoking near bedding or upholstery.   []   Sexuality: Discussed sexually transmitted diseases, partner selection, use of condoms, avoidance of unintended pregnancy  and contraceptive alternatives.   [x]   Dental health: Discussed importance of regular tooth brushing, flossing, and dental visits.  [x]   Health maintenance and immunizations reviewed. Please refer to Health maintenance section.        Hypertension Blood pressure readings consistently in the 140s. No reported fluid issues. Family history of  kidney disease. -Continue Amlodipine  5 mg.  -Monitor blood pressure regularly. If consistently in the 140s, consider reaching out for medication adjustment.  Weight Management/PCOS On Wegovy , experiencing some side effects including nausea and indigestion. Noted weight loss and improved dietary habits. -Continue Wegovy  as prescribed through online health program.  -Continue dietary modifications and regular exercise. -Consider prescribing medication for heartburn if symptoms persist with increased Wegovy  dosage.  Preventive Care Up to date on mammogram and Pap smear. Vaccines administered today include flu and tetanus. -Obtain mammogram results from OB GYN. -Check blood work today, including kidney function in light of family history of kidney disease. -Encourage regular eye and dental check-ups.  Follow-up in 6 months to monitor progress and blood pressure control.        Subjective:    Chief Complaint  Patient presents with   Annual Exam    Pt here for annual exam - pt is fasting today - w/o any concerns, pt declined pap today, due for colonoscopy - mammogram done [facility] on 01/05/24   Anxiety   Depression   Diabetes   Hypertension     Patient is in today for annual exam. Sees [facility] for female care. Per pt pap and mammo UTD, need records sent here. Doing very well with nutrition. Taking Wegovy  and has been losing weight, going through [employer].  Past Medical History:  Diagnosis Date   Allergy    Anxiety    Depression    GDM, class A2 09/22/2015   GERD (gastroesophageal reflux disease)    Gestational diabetes  Gestational diabetes mellitus 08/23/2015   Gestational diabetes mellitus, antepartum    Hx of varicella    Hyperprolactinemia (HCC)    Infertility, female    Medical history non-contributory    PCOS (polycystic ovarian syndrome)    PIH (pregnancy induced hypertension) 09/24/2015   Postpartum care following cesarean delivery  (9/24) 09/23/2015    Past Surgical History:  Procedure Laterality Date   CESAREAN SECTION N/A 09/23/2015   Procedure: CESAREAN SECTION;  Surgeon: Dickie Carder, MD;  Location: [facility];  Service: Obstetrics;  Laterality: N/A;   HERNIA REPAIR      Family History  Problem Relation Age of Onset   Diabetes Mother    Cancer Mother    Heart disease Mother    Depression Mother    Arthritis Mother    Asthma Mother    Diabetes Father    Kidney disease Father    Endometriosis Sister    Depression Sister    Hypertension Sister    Kidney disease Sister    Diabetes Sister    Hypertension Maternal Grandmother     Social History   Tobacco Use   Smoking status: Never   Smokeless tobacco: Never   Tobacco comments:    I do not smoke ever  Vaping Use   Vaping status: Never Used  Substance Use Topics   Alcohol use: Not Currently    Comment: Social Drinker   Drug use: No     Allergies  Allergen Reactions   Latex    Metformin  And Related Diarrhea    Pt had severe bloating and cramping with diarrhea    Paxlovid  [Nirmatrelvir -Ritonavir ] Itching and Rash    Review of Systems NEGATIVE UNLESS OTHERWISE INDICATED IN HPI      Objective:     BP (!) 140/83   Pulse 75   Temp 97.6 F (36.4 C)   Ht 5' 8 (1.727 m)   Wt 269 lb (122 kg)   LMP 01/08/2024   SpO2 100%   BMI 40.90 kg/m   Wt Readings from Last 3 Encounters:  01/08/24 269 lb (122 kg)  09/08/23 280 lb (127 kg)  05/19/23 280 lb 9.6 oz (127.3 kg)    BP Readings from Last 3 Encounters:  01/08/24 (!) 140/83  05/19/23 (!) 143/78  11/18/22 (!) 156/84     Physical Exam Vitals and nursing note reviewed.  Constitutional:      Appearance: Normal appearance. She is obese. She is not toxic-appearing.  HENT:     Head: Normocephalic and atraumatic.     Right Ear: Tympanic membrane, ear canal and external ear normal.     Left Ear: Tympanic membrane, ear canal and external ear normal.     Nose: Nose normal.      Mouth/Throat:     Mouth: Mucous membranes are moist.  Eyes:     Extraocular Movements: Extraocular movements intact.     Conjunctiva/sclera: Conjunctivae normal.     Pupils: Pupils are equal, round, and reactive to light.  Cardiovascular:     Rate and Rhythm: Normal rate and regular rhythm.     Pulses: Normal pulses.     Heart sounds: Normal heart sounds.  Pulmonary:     Effort: Pulmonary effort is normal.     Breath sounds: Normal breath sounds.  Abdominal:     General: Abdomen is flat. Bowel sounds are normal.     Palpations: Abdomen is soft.  Musculoskeletal:        General: Normal range of motion.  Cervical back: Normal range of motion and neck supple.  Skin:    General: Skin is warm and dry.     Comments: Hirsutism   Neurological:     General: No focal deficit present.     Mental Status: She is alert and oriented to person, place, and time.  Psychiatric:        Mood and Affect: Mood normal.        Behavior: Behavior normal.        Thought Content: Thought content normal.        Judgment: Judgment normal.         Shine Scrogham M Brock Larmon, PA-C

## 2024-01-09 ENCOUNTER — Encounter: Payer: Self-pay | Admitting: Physician Assistant

## 2024-01-30 ENCOUNTER — Other Ambulatory Visit: Payer: Self-pay | Admitting: Physician Assistant

## 2024-01-30 DIAGNOSIS — I10 Essential (primary) hypertension: Secondary | ICD-10-CM

## 2024-03-24 ENCOUNTER — Ambulatory Visit (INDEPENDENT_AMBULATORY_CARE_PROVIDER_SITE_OTHER): Admitting: Physician Assistant

## 2024-03-24 ENCOUNTER — Encounter: Payer: Self-pay | Admitting: Physician Assistant

## 2024-03-24 VITALS — BP 126/80 | HR 76 | Temp 98.0°F | Ht 68.0 in | Wt 262.0 lb

## 2024-03-24 DIAGNOSIS — R350 Frequency of micturition: Secondary | ICD-10-CM

## 2024-03-24 LAB — POC URINALSYSI DIPSTICK (AUTOMATED)
Bilirubin, UA: NEGATIVE
Glucose, UA: NEGATIVE
Ketones, UA: NEGATIVE
Nitrite, UA: NEGATIVE
Protein, UA: NEGATIVE
Spec Grav, UA: 1.02 (ref 1.010–1.025)
Urobilinogen, UA: 0.2 U/dL
pH, UA: 6 (ref 5.0–8.0)

## 2024-03-24 MED ORDER — FLUCONAZOLE 150 MG PO TABS: 150.0000 mg | ORAL_TABLET | Freq: Every day | ORAL | 0 refills | Status: DC

## 2024-03-24 MED ORDER — CEPHALEXIN 500 MG PO CAPS: 500.0000 mg | ORAL_CAPSULE | Freq: Three times a day (TID) | ORAL | 0 refills | Status: AC

## 2024-03-24 NOTE — Progress Notes (Signed)
 Patient ID: Katherine Hood, female    DOB: January 18, 1976, 48 y.o.   MRN: [id number]   Assessment & Plan:  Frequency of urination -     POCT Urinalysis Dipstick (Automated) -     Urine Culture  Other orders -     Cephalexin; Take 1 capsule (500 mg total) by mouth 3 (three) times daily for 7 days.  Dispense: 21 capsule; Refill: 0 -     Fluconazole; Take 1 tablet (150 mg total) by mouth daily. Repeat dose in 72 hours if still symptomatic.  Dispense: 2 tablet; Refill: 0      Assessment and Plan Assessment & Plan Urinary Tract Infection (UTI) Symptoms of urinary urgency and pressure since Monday, with microscopic hematuria. No dysuria reported. Differential diagnosis included nephrolithiasis, but symptoms and history are more consistent with a UTI. Recent viral illness with dehydration may have contributed to susceptibility. Informed consent was obtained for Keflex, typically effective for UTIs, with plans to adjust based on culture results. - Prescribe Keflex three times a day for 5-7 days based on symptom response - Send urine for culture to confirm antibiotic sensitivity - Advise taking a probiotic to maintain gut health - Prescribe Diflucan for potential candidiasis - Recommend phenazopyridine for symptom relief for up to two days - Advise maintaining hydration  Gastroenteritis Recent viral gastroenteritis with symptoms of vomiting, diarrhea, and weakness. Symptoms have improved, but she still experiences some gastrointestinal upset, possibly exacerbated by [facility]. Advised that post-viral gastrointestinal symptoms can take a few weeks to resolve. - Monitor symptoms and consider adjusting Wegovy dosage with her prescriber if gastrointestinal symptoms persist for more than a month      Return if symptoms worsen or fail to improve.    Subjective:    Chief Complaint  Patient presents with   Urinary Frequency    Pt states lower back started hurting Sunday then Monday getting  worse pressure like she still has to go even after emptying bladder.    Urinary Frequency  Associated symptoms include frequency.   Discussed the use of AI scribe software for clinical note transcription with the patient, who gave verbal consent to proceed.  History of Present Illness Katherine Hood is a 48 year old female who presents with urinary symptoms suggestive of a urinary tract infection (UTI).  She has been experiencing urinary symptoms since Monday, including a persistent sensation of needing to urinate even after voiding, pressure across the bladder, and microscopic hematuria. There is no dysuria, which she associates with past UTIs. She has been taking cranberry pills, which provided some relief.  She recalls feeling unwell the week after her menstrual cycle, with symptoms starting on a Monday when she was unable to assist her sister due to severe weakness and gastrointestinal symptoms, including vomiting and diarrhea. Her condition improved by Wednesday when she could tolerate Pedialyte and bland foods. She returned to work on Thursday, still feeling weak and dehydrated, and took Imodium to manage her symptoms. Her son and husband subsequently experienced similar symptoms, suggesting a viral illness.  She is currently on Wegovy and reports increased bowel movements with loose stools, which have persisted since starting the medication. She has been on a 1.7 mg dose for three months and notes that the frequency of bowel movements increased after her recent illness. She is considering FMLA due to the impact on her work schedule.  She has a history of high blood pressure, which she has managed through lifestyle changes, including diet  and exercise, and is currently off medication for it.     Past Medical History:  Diagnosis Date   Allergy    Anxiety    Depression    GDM, class A2 09/22/2015   GERD (gastroesophageal reflux disease)    Gestational diabetes    Gestational  diabetes mellitus 08/23/2015   Gestational diabetes mellitus, antepartum    Hx of varicella    Hyperprolactinemia (HCC)    Infertility, female    Medical history non-contributory    PCOS (polycystic ovarian syndrome)    PIH (pregnancy induced hypertension) 09/24/2015   Postpartum care following cesarean delivery (9/24) 09/23/2015    Past Surgical History:  Procedure Laterality Date   CESAREAN SECTION N/A 09/23/2015   Procedure: CESAREAN SECTION;  Surgeon: Maxie Better, MD;  Location: [facility];  Service: Obstetrics;  Laterality: N/A;   HERNIA REPAIR      Family History  Problem Relation Age of Onset   Diabetes Mother    Cancer Mother    Heart disease Mother    Depression Mother    Arthritis Mother    Asthma Mother    Diabetes Father    Kidney disease Father    Endometriosis Sister    Depression Sister    Hypertension Sister    Kidney disease Sister    Diabetes Sister    Hypertension Maternal Grandmother     Social History   Tobacco Use   Smoking status: Never   Smokeless tobacco: Never   Tobacco comments:    I do not smoke ever  Vaping Use   Vaping status: Never Used  Substance Use Topics   Alcohol use: Not Currently    Comment: Social Drinker   Drug use: No     Allergies  Allergen Reactions   Latex    Metformin And Related Diarrhea    Pt had severe bloating and cramping with diarrhea    Paxlovid [Nirmatrelvir-Ritonavir] Itching and Rash    Review of Systems  Genitourinary:  Positive for frequency.   NEGATIVE UNLESS OTHERWISE INDICATED IN HPI      Objective:     BP 126/80   Pulse 76   Temp 98 F (36.7 C) (Temporal)   Ht 5\' 8"  (1.727 m)   Wt 262 lb (118.8 kg)   LMP 03/03/2024 (Approximate)   SpO2 98%   BMI 39.84 kg/m   Wt Readings from Last 3 Encounters:  03/24/24 262 lb (118.8 kg)  01/08/24 269 lb (122 kg)  09/08/23 280 lb (127 kg)    BP Readings from Last 3 Encounters:  03/24/24 126/80  01/08/24 (!) 140/83  05/19/23 (!)  143/78     Physical Exam Vitals and nursing note reviewed.  Constitutional:      General: She is not in acute distress.    Appearance: Normal appearance. She is not ill-appearing.  HENT:     Head: Normocephalic and atraumatic.  Cardiovascular:     Rate and Rhythm: Normal rate and regular rhythm.     Pulses: Normal pulses.     Heart sounds: Normal heart sounds.  Pulmonary:     Effort: Pulmonary effort is normal.     Breath sounds: Normal breath sounds.  Abdominal:     General: Abdomen is flat. Bowel sounds are normal.     Palpations: Abdomen is soft.     Tenderness: There is no right CVA tenderness or left CVA tenderness.  Skin:    General: Skin is warm and dry.  Neurological:  General: No focal deficit present.     Mental Status: She is alert.  Psychiatric:        Mood and Affect: Mood normal.             Matika Bartell M Stacyann Mcconaughy, PA-C

## 2024-03-27 LAB — URINE CULTURE
MICRO NUMBER:: 16256091
SPECIMEN QUALITY:: ADEQUATE

## 2024-03-29 ENCOUNTER — Encounter: Payer: Self-pay | Admitting: Physician Assistant

## 2024-07-08 ENCOUNTER — Ambulatory Visit: Payer: BC Managed Care – PPO | Admitting: Physician Assistant

## 2024-08-04 ENCOUNTER — Other Ambulatory Visit: Payer: Self-pay

## 2024-08-04 ENCOUNTER — Ambulatory Visit

## 2024-08-04 ENCOUNTER — Emergency Department (HOSPITAL_BASED_OUTPATIENT_CLINIC_OR_DEPARTMENT_OTHER)
Admission: EM | Admit: 2024-08-04 | Discharge: 2024-08-04 | Disposition: A | Discharge status: Home / Self Care | Attending: Emergency Medicine | Admitting: Emergency Medicine

## 2024-08-04 ENCOUNTER — Encounter (HOSPITAL_BASED_OUTPATIENT_CLINIC_OR_DEPARTMENT_OTHER): Payer: Self-pay

## 2024-08-04 DIAGNOSIS — Z91040 Latex allergy status: Secondary | ICD-10-CM | POA: Insufficient documentation

## 2024-08-04 DIAGNOSIS — Z79899 Other long term (current) drug therapy: Secondary | ICD-10-CM | POA: Diagnosis not present

## 2024-08-04 DIAGNOSIS — R519 Headache, unspecified: Secondary | ICD-10-CM | POA: Insufficient documentation

## 2024-08-04 DIAGNOSIS — I10 Essential (primary) hypertension: Secondary | ICD-10-CM | POA: Insufficient documentation

## 2024-08-04 DIAGNOSIS — R5383 Other fatigue: Secondary | ICD-10-CM | POA: Insufficient documentation

## 2024-08-04 LAB — URINALYSIS, ROUTINE W REFLEX MICROSCOPIC
Bacteria, UA: NONE SEEN
Bilirubin Urine: NEGATIVE
Glucose, UA: NEGATIVE mg/dL
Ketones, ur: NEGATIVE mg/dL
Leukocytes,Ua: NEGATIVE
Nitrite: NEGATIVE
Protein, ur: NEGATIVE mg/dL
Specific Gravity, Urine: 1.025 (ref 1.005–1.030)
pH: 6.5 (ref 5.0–8.0)

## 2024-08-04 LAB — COMPREHENSIVE METABOLIC PANEL WITH GFR
ALT: 8 U/L (ref 0–44)
AST: 15 U/L (ref 15–41)
Albumin: 4.3 g/dL (ref 3.5–5.0)
Alkaline Phosphatase: 115 U/L (ref 38–126)
Anion gap: 13 (ref 5–15)
BUN: 9 mg/dL (ref 6–20)
CO2: 22 mmol/L (ref 22–32)
Calcium: 10.2 mg/dL (ref 8.9–10.3)
Chloride: 105 mmol/L (ref 98–111)
Creatinine, Ser: 1.07 mg/dL — ABNORMAL HIGH (ref 0.44–1.00)
GFR, Estimated: >60 mL/min (ref >60)
Glucose, Bld: 96 mg/dL (ref 70–99)
Potassium: 3.8 mmol/L (ref 3.5–5.1)
Sodium: 139 mmol/L (ref 135–145)
Total Bilirubin: 0.4 mg/dL (ref 0.0–1.2)
Total Protein: 7.9 g/dL (ref 6.5–8.1)

## 2024-08-04 LAB — CBC
HCT: 38.8 % (ref 36.0–46.0)
Hemoglobin: 12.8 g/dL (ref 12.0–15.0)
MCH: 26.9 pg (ref 26.0–34.0)
MCHC: 33 g/dL (ref 30.0–36.0)
MCV: 81.7 fL (ref 80.0–100.0)
Platelets: 429 K/uL — ABNORMAL HIGH (ref 150–400)
RBC: 4.75 MIL/uL (ref 3.87–5.11)
RDW: 14.4 % (ref 11.5–15.5)
WBC: 7.5 K/uL (ref 4.0–10.5)
nRBC: 0 % (ref 0.0–0.2)

## 2024-08-04 LAB — PREGNANCY, URINE: Preg Test, Ur: NEGATIVE

## 2024-08-04 LAB — RESP PANEL BY RT-PCR (RSV, FLU A&B, COVID)  RVPGX2
Influenza A by PCR: NEGATIVE
Influenza B by PCR: NEGATIVE
Resp Syncytial Virus by PCR: NEGATIVE
SARS Coronavirus 2 by RT PCR: NEGATIVE

## 2024-08-04 LAB — LIPASE, BLOOD: Lipase: 26 U/L (ref 11–51)

## 2024-08-04 MED ORDER — ONDANSETRON 4 MG PO TBDP: 4.0000 mg | ORAL_TABLET | Freq: Three times a day (TID) | ORAL | 0 refills | Status: DC | PRN

## 2024-08-04 MED ORDER — SODIUM CHLORIDE 0.9 % IV BOLUS
1000.0000 mL | Freq: Once | INTRAVENOUS | Status: AC
  Administered 2024-08-04: 1000 mL via INTRAVENOUS

## 2024-08-04 MED ORDER — ONDANSETRON HCL 4 MG/2ML IJ SOLN
4.0000 mg | Freq: Once | INTRAMUSCULAR | Status: AC
  Administered 2024-08-04: 4 mg via INTRAVENOUS
  Filled 2024-08-04: qty 2

## 2024-08-04 NOTE — ED Triage Notes (Signed)
 Patient reports feeling fatigued yesterday, vomited x 1; contant nausea with decline in appetite; also reported heavy menstration (x 1 tampons with pads) since Saturday; endorses left extremity tingling/ numbness today; reports taking Wegovy  & BP meds but unable to tolerate PO meds today; A/O x 4; skin warm/ dry; spontaneous respirations even non-labored

## 2024-08-04 NOTE — ED Provider Notes (Signed)
  EMERGENCY DEPARTMENT AT [facility] Provider Note   CSN: [id number] Arrival date & time: 08/04/24  1501     Patient presents with: Nausea, Emesis, Fatigue, and Numbness   Katherine Hood is a 48 y.o. female w/ hx of HTN, presenting to the ED with fatigue and headache and nausea.  Onset yesterday.  She reports feeling extremely exhausted, no energy, no appetite.  She dry heaved once yesterday.  Reports headache yesterday, but not today.  She is on day 5 of her menstrual cycle, which is monthly, but heavier than normal this month.  Denies known hx of anemia and does not take iron  regularly.    She denies sore throat, coughing, sick contacts at home  Surgical hx of C-section and hernia repair  Denies persistent abdominal pain, constipation.   HPI     Prior to Admission medications   Medication Sig Start Date End Date Taking? Authorizing Provider  ondansetron  (ZOFRAN -ODT) 4 MG disintegrating tablet Take 1 tablet (4 mg total) by mouth every 8 (eight) hours as needed for up to 12 doses for nausea or vomiting. 08/04/24  Yes Cottie Donnice PARAS, MD  albuterol  (VENTOLIN  HFA) 108 (90 Base) MCG/ACT inhaler Inhale 1-2 puffs into the lungs every 6 (six) hours as needed for wheezing or shortness of breath. 11/18/22   Berneta Elsie Sayre, MD  amLODipine  (NORVASC ) 5 MG tablet TAKE 1 TABLET (5 MG TOTAL) BY MOUTH DAILY. 01/30/24 04/29/24  Allwardt, Alyssa M, PA-C  cetirizine  (ZYRTEC  ALLERGY) 10 MG tablet Take 1 tablet (10 mg total) by mouth daily. 06/08/21   Christopher Savannah, PA-C  fluconazole  (DIFLUCAN ) 150 MG tablet Take 1 tablet (150 mg total) by mouth daily. Repeat dose in 72 hours if still symptomatic. 03/24/24   Allwardt, Alyssa M, PA-C  fluticasone  (FLONASE ) 50 MCG/ACT nasal spray Place 1 spray into both nostrils daily. 08/02/20   Adah Corning A, FNP  ibuprofen  (ADVIL ) 600 MG tablet Take 1 tablet (600 mg total) by mouth every 6 (six) hours as needed. 06/17/21   Cleotilde Rogue, MD   loratadine (CLARITIN) 10 MG tablet Take 10 mg by mouth daily.    [provider]  methocarbamol  (ROBAXIN ) 500 MG tablet Take 1 tablet (500 mg total) by mouth 2 (two) times daily as needed for muscle spasms. 06/17/21   Cleotilde Rogue, MD  nitroGLYCERIN  (NITRODUR - DOSED IN MG/24 HR) 0.2 mg/hr patch PLACE 1/4-1/2 PATCH OVER AFFECTED REGION.REMOVE & REPLACE ONCE DAILY. SLIGHTLY ALTER PLACEMENT DAILY Patient not taking: Reported on 03/24/2024 11/19/22   Joane Artist RAMAN, MD  Semaglutide -Weight Management (WEGOVY ) 0.25 MG/0.5ML SOAJ Inject 0.25 mg into the skin once a week. Patient taking differently: Inject into the skin once a week. 1.7mg  05/28/23   Allwardt, Mardy HERO, PA-C  WEGOVY  1.7 MG/0.75ML SOAJ 1.7 mg. 12/30/23   [provider]  triamcinolone  (NASACORT ) 55 MCG/ACT AERO nasal inhaler Place 2 sprays into the nose daily. 03/15/19 08/02/20  Tellis Laneta NOVAK, NP    Allergies: Latex, Metformin  and related, and Paxlovid  [nirmatrelvir -ritonavir ]    Review of Systems  Updated Vital Signs BP (!) (P) 151/79   Pulse (P) 70   Temp (P) 98.4 F (36.9 C) (Oral)   Resp (P) 10   Ht 5' 8 (1.727 m)   Wt 116.4 kg   LMP 07/31/2024   SpO2 (P) 100%   BMI 39.02 kg/m   Physical Exam Constitutional:      General: She is not in acute distress. HENT:  Head: Normocephalic and atraumatic.  Eyes:     Conjunctiva/sclera: Conjunctivae normal.     Pupils: Pupils are equal, round, and reactive to light.  Cardiovascular:     Rate and Rhythm: Normal rate and regular rhythm.  Pulmonary:     Effort: Pulmonary effort is normal. No respiratory distress.  Abdominal:     General: There is no distension.     Tenderness: There is no abdominal tenderness. There is no guarding or rebound.  Skin:    General: Skin is warm and dry.  Neurological:     General: No focal deficit present.     Mental Status: She is alert. Mental status is at baseline.  Psychiatric:        Mood and Affect: Mood normal.         Behavior: Behavior normal.     (all labs ordered are listed, but only abnormal results are displayed) Labs Reviewed  COMPREHENSIVE METABOLIC PANEL WITH GFR - Abnormal; Notable for the following components:      Result Value   Creatinine, Ser 1.07 (*)    All other components within normal limits  CBC - Abnormal; Notable for the following components:   Platelets 429 (*)    All other components within normal limits  URINALYSIS, ROUTINE W REFLEX MICROSCOPIC - Abnormal; Notable for the following components:   Hgb urine dipstick MODERATE (*)    All other components within normal limits  RESP PANEL BY RT-PCR (RSV, FLU A&B, COVID)  RVPGX2  PREGNANCY, URINE  LIPASE, BLOOD    EKG: EKG Interpretation Date/Time:  Wednesday August 04 2024 15:14:42 EDT Ventricular Rate:  68 PR Interval:  156 QRS Duration:  98 QT Interval:  411 QTC Calculation: 438 R Axis:   21  Text Interpretation: Sinus rhythm Low voltage, precordial leads Posterior infarct, old Confirmed by Cottie Cough [phone]) on 08/04/2024 3:24:49 PM  Radiology: No results found.   Procedures   Medications Ordered in the ED  sodium chloride  0.9 % bolus 1,000 mL (1,000 mLs Intravenous New Bag/Given 08/04/24 1614)  ondansetron  (ZOFRAN ) injection 4 mg (4 mg Intravenous Given 08/04/24 1614)                                    Medical Decision Making Amount and/or Complexity of Data Reviewed Labs: ordered.  Risk Prescription drug management.   This patient presents to the ED with concern for fatigue, poor appetite. This involves an extensive number of treatment options, and is a complaint that carries with it a high risk of complications and morbidity.  The differential diagnosis includes anemia vs biliary disease vs pancreatitis vs dehydration vs viral illness vs other I ordered and personally interpreted labs.  The pertinent results include:  no emergent findings  The patient was maintained on a cardiac monitor.  I  personally viewed and interpreted the cardiac monitored which showed an underlying rhythm of: NSR  Per my interpretation the patient's ECG shows no acute ischemic findings  I ordered medication including IV fluids and nausea medications  I have reviewed the patients home medicines and have made adjustments as needed  Test Considered: No abdominal tenderness on exam or lab findings suggestive of acute inflammatory or infectious or surgical abdominal process to warrant emergent CT imaging.  After the interventions noted above, I reevaluated the patient and found that they have: stayed the same  Based on this workup, I have a low  suspicion for ACS, PE, PTX, sepsis, or other life threatening medical emergency.  I don't feel the patient needs CT imaging at this time.  I doubt stroke or MS. On reassessment she continues to have on chest pain or pressure.  This may be a viral syndrome given her nausea, change in taste/appetite, and headache.  I advised rest and hydration for the next few days and provided a work note.  Her husband was present for this conversation.  Disposition:  After consideration of the diagnostic results and the patient's response to treatment, I feel that the patient would benefit from outpatient follow up      Final diagnoses:  Other fatigue    ED Discharge Orders          Ordered    ondansetron  (ZOFRAN -ODT) 4 MG disintegrating tablet  Every 8 hours PRN        08/04/24 1724               Cottie Donnice PARAS, MD 08/04/24 1726

## 2024-08-04 NOTE — Discharge Instructions (Addendum)
 If you have new or worsening chest pain, dizziness, difficulty breathing, lightheadedness, or any other emergency concerns, you should return to the hospital.

## 2024-08-05 ENCOUNTER — Telehealth: Payer: Self-pay | Admitting: Physician Assistant

## 2024-08-05 NOTE — Telephone Encounter (Signed)
 Patient scheduled; seen in ED 08/04/24

## 2024-08-05 NOTE — Telephone Encounter (Signed)
 LVM to Triage Patient for symptoms listed re: OV scheduled for 08/11/24

## 2024-08-07 ENCOUNTER — Other Ambulatory Visit: Payer: Self-pay | Admitting: Physician Assistant

## 2024-08-07 DIAGNOSIS — I10 Essential (primary) hypertension: Secondary | ICD-10-CM

## 2024-08-11 ENCOUNTER — Inpatient Hospital Stay: Admitting: Physician Assistant

## 2024-09-17 ENCOUNTER — Encounter: Payer: Self-pay | Admitting: Physician Assistant

## 2024-09-17 ENCOUNTER — Other Ambulatory Visit: Payer: Self-pay

## 2024-10-04 ENCOUNTER — Encounter: Payer: Self-pay | Admitting: Physician Assistant

## 2024-10-04 ENCOUNTER — Ambulatory Visit: Payer: Self-pay | Admitting: Physician Assistant

## 2024-10-04 ENCOUNTER — Ambulatory Visit (INDEPENDENT_AMBULATORY_CARE_PROVIDER_SITE_OTHER): Admitting: Physician Assistant

## 2024-10-04 VITALS — BP 155/93 | HR 87 | Temp 97.7°F | Wt 244.0 lb

## 2024-10-04 DIAGNOSIS — F410 Panic disorder [episodic paroxysmal anxiety] without agoraphobia: Secondary | ICD-10-CM | POA: Diagnosis not present

## 2024-10-04 DIAGNOSIS — N926 Irregular menstruation, unspecified: Secondary | ICD-10-CM | POA: Diagnosis not present

## 2024-10-04 DIAGNOSIS — Z6837 Body mass index (BMI) 37.0-37.9, adult: Secondary | ICD-10-CM

## 2024-10-04 DIAGNOSIS — E282 Polycystic ovarian syndrome: Secondary | ICD-10-CM

## 2024-10-04 DIAGNOSIS — E661 Drug-induced obesity: Secondary | ICD-10-CM

## 2024-10-04 DIAGNOSIS — E66812 Obesity, class 2: Secondary | ICD-10-CM | POA: Diagnosis not present

## 2024-10-04 LAB — BETA HCG QUANT (REF LAB): hCG Quant: 1440 m[IU]/mL

## 2024-10-04 NOTE — Progress Notes (Signed)
 Patient ID: KALYNNE WOMAC, female    DOB: 12-Apr-1976, 48 y.o.   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 Concerns about potential ectopic pregnancy due to previous ectopic pregnancy. No current pain or bleeding. Uncertainty about timing of Plan B administration relative to ovulation. - Order blood test for HCG levels - Refer to gynecologist, Dr. Rutherford, for further evaluation - Advise to contact Dr. Rutherford' office ASAP  Polycystic ovary syndrome (PCOS) PCOS with regular menstrual cycles post-childbirth. Current concern about missed period and potential pregnancy.  Panic disorder with anxiety and depression Panic attacks and anxiety exacerbated by work-related stress and potential job loss. Previous trials of escitalopram and Zoloft  were not well-tolerated. Hydroxyzine  used for acute panic but is contraindicated in early pregnancy. Current stressors include job uncertainty and potential pregnancy. - Restart escitalopram, 10 mg, starting with 5 mg for a few days, taken with breakfast - Discontinue hydroxyzine  due to pregnancy - Consider alternative medications with gynecologist's input  Obesity, currently managed with GLP-1 agonist - Wegovy ; managed by outside provider      Return in about 4 weeks (around 11/01/2024) for recheck/follow-up.    Subjective:    Chief Complaint  Patient presents with   Panic Attack    Has had for the last few years; last episode last thursday    HPI Discussed the use of AI scribe software for clinical note transcription with the patient, who gave verbal consent to proceed.  History of Present  Illness RYANNE MORAND is a 48 year old female who presents with a positive pregnancy test and concerns about anxiety and stress related to potential job loss.  She has been experiencing significant stress and anxiety since March due to rumors of potential layoffs at her workplace. This stress has led to panic attacks, for which she initially contacted the Employee Assistance Program and was prescribed escitalopram. However, she experienced side effects such as irritability and agitation, leading to a switch to Zoloft , which made her feel down and sad. She has since stopped taking Zoloft  and is currently not on any antidepressants.  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 She returned to work last Monday, but was informed of impending layoffs, which has exacerbated her stress and anxiety. She experienced a panic attack last Thursday and is currently on leave again.  She reports a positive pregnancy test after taking Plan B following an unplanned sexual encounter with her husband. She is concerned about the timing of the pregnancy, given her current stress and job situation. She has a history of ectopic pregnancy and is worried about the possibility of another. She has not experienced any bleeding or morning sickness, but notes the absence of her usual premenstrual symptoms.  Her last menstrual period started on August 31 and ended around September 5 or 6. She has a history of PCOS but has not missed a cycle since having her son. She is not on birth control, and her husband has not had a vasectomy.  She is currently taking Wegovy  2.4 mg once a week and has  noted mood changes. She has used hydroxyzine  25 mg for panic attacks, which helps but causes significant sedation, impacting her ability to work. She stopped taking Zoloft  a couple of weeks ago due to its side effects.     Past Medical History:  Diagnosis Date   Allergy    Anxiety    Depression    GDM,  class A2 09/22/2015   GERD (gastroesophageal reflux disease)    Gestational diabetes    Gestational diabetes mellitus 08/23/2015   Gestational diabetes mellitus, antepartum    Hx of varicella    Hyperprolactinemia    Infertility, female    Medical history non-contributory    PCOS (polycystic ovarian syndrome)    PIH (pregnancy induced hypertension) 09/24/2015   Postpartum care following cesarean delivery (9/24) 09/23/2015    Past Surgical History:  Procedure Laterality Date   CESAREAN SECTION N/A 09/23/2015   Procedure: CESAREAN SECTION;  Surgeon: Dickie Carder, MD;  Location: [facility];  Service: Obstetrics;  Laterality: N/A;   HERNIA REPAIR      Family History  Problem Relation Age of Onset   Diabetes Mother    Cancer Mother    Heart disease Mother    Depression Mother    Arthritis Mother    Asthma Mother    Diabetes Father    Kidney disease Father    Endometriosis Sister    Depression Sister    Hypertension Sister    Kidney disease Sister    Diabetes Sister    Hypertension Maternal Grandmother     Social History   Tobacco Use   Smoking status: Never   Smokeless tobacco: Never   Tobacco comments:    I do not smoke ever  Vaping Use   Vaping status: Never Used  Substance Use Topics   Alcohol use: Not Currently    Comment: Social Drinker   Drug use: No     Allergies  Allergen Reactions   Latex    Metformin  And Related Diarrhea    Pt had severe bloating and cramping with diarrhea    Paxlovid  [Nirmatrelvir -Ritonavir ] Itching and Rash    Review of Systems NEGATIVE UNLESS OTHERWISE INDICATED IN HPI      Objective:     BP (!) 155/93   Pulse 87   Temp 97.7 F (36.5 C)   Wt 244 lb (110.7 kg)   LMP 08/29/2024   SpO2 100%   BMI 37.10 kg/m   Wt Readings from Last 3 Encounters:  10/04/24 244 lb (110.7 kg)  08/04/24 256 lb 9.6 oz (116.4 kg)  03/24/24 262 lb (118.8 kg)    BP Readings from Last 3 Encounters:  10/04/24 (!) 155/93  08/04/24  132/76  03/24/24 126/80     Physical Exam Vitals and nursing note reviewed.  Constitutional:      General: She is not in acute distress.    Appearance: Normal appearance. She is obese. She is not ill-appearing.  HENT:     Head: Normocephalic.     Right Ear: External ear normal.     Left Ear: External ear normal.  Eyes:     Extraocular Movements: Extraocular movements intact.     Conjunctiva/sclera: Conjunctivae normal.     Pupils: Pupils are equal, round, and reactive to light.  Cardiovascular:     Rate and Rhythm: Normal rate and regular rhythm.     Pulses: Normal pulses.     Heart sounds: Normal heart sounds. No murmur heard. Pulmonary:     Effort: Pulmonary effort  is normal. No respiratory distress.     Breath sounds: Normal breath sounds. No wheezing.  Musculoskeletal:     Cervical back: Normal range of motion.  Skin:    General: Skin is warm.  Neurological:     Mental Status: She is alert and oriented to person, place, and time.  Psychiatric:        Mood and Affect: Mood is anxious. Affect is tearful.             Van Ehlert M Tejah Brekke, PA-C

## 2024-10-04 NOTE — Patient Instructions (Signed)
  VISIT SUMMARY: During your visit, we discussed your positive pregnancy test, concerns about anxiety and stress related to potential job loss, and your history of PCOS and panic disorder. We also reviewed your current medications and their side effects.  YOUR PLAN: MISSED PERIOD AND POSITIVE PREGNANCY TEST: You have missed your period since August 31st and have a positive home pregnancy test. Given your history of ectopic pregnancy, we need to ensure everything is progressing normally. -We will order a blood test to check your HCG levels. -You should contact Dr. Rutherford, your gynecologist, as soon as possible for further evaluation.   PANIC DISORDER WITH ANXIETY AND DEPRESSION: You have been experiencing panic attacks and anxiety, especially due to work-related stress and potential job loss. Previous medications were not well-tolerated. -Restart escitalopram at 10 mg, beginning with 5 mg for a few days, taken with breakfast. -Stop taking hydroxyzine  due to your pregnancy. -We will consider alternative medications with input from your gynecologist.                      Contains text generated by Abridge.                                 Contains text generated by Abridge.

## 2024-11-10 ENCOUNTER — Encounter: Payer: Self-pay | Admitting: Physician Assistant

## 2024-11-10 ENCOUNTER — Ambulatory Visit (INDEPENDENT_AMBULATORY_CARE_PROVIDER_SITE_OTHER): Admitting: Physician Assistant

## 2024-11-10 VITALS — BP 124/84 | HR 80 | Temp 97.6°F | Ht 68.0 in | Wt 246.6 lb

## 2024-11-10 DIAGNOSIS — F419 Anxiety disorder, unspecified: Secondary | ICD-10-CM

## 2024-11-10 DIAGNOSIS — I10 Essential (primary) hypertension: Secondary | ICD-10-CM

## 2024-11-10 DIAGNOSIS — F32A Depression, unspecified: Secondary | ICD-10-CM

## 2024-11-10 DIAGNOSIS — Z1211 Encounter for screening for malignant neoplasm of colon: Secondary | ICD-10-CM

## 2024-11-10 DIAGNOSIS — Z23 Encounter for immunization: Secondary | ICD-10-CM

## 2024-11-10 DIAGNOSIS — Z8759 Personal history of other complications of pregnancy, childbirth and the puerperium: Secondary | ICD-10-CM

## 2024-11-10 MED ORDER — ONDANSETRON 4 MG PO TBDP: 4.0000 mg | ORAL_TABLET | Freq: Three times a day (TID) | ORAL | 1 refills | Status: AC | PRN

## 2024-11-10 MED ORDER — AMLODIPINE BESYLATE 5 MG PO TABS: 5.0000 mg | ORAL_TABLET | Freq: Every day | ORAL | 1 refills | Status: AC

## 2024-11-10 NOTE — Progress Notes (Signed)
 Patient ID: Katherine Hood, female    DOB: 11/15/1976, 48 y.o.   MRN: [id number]   Assessment & Plan:  Essential hypertension -     amLODIPine  Besylate; Take 1 tablet (5 mg total) by mouth daily.  Dispense: 90 tablet; Refill: 1  Screening for colon cancer -     Ambulatory referral to Gastroenterology  Immunization due -     Flu vaccine trivalent PF, 6mos and older(Flulaval,Afluria,Fluarix,Fluzone) -     Pneumococcal conjugate vaccine 20-valent  Anxiety and depression  History of miscarriage  Other orders -     Ondansetron ; Take 1 tablet (4 mg total) by mouth every 8 (eight) hours as needed for up to 10 doses for nausea or vomiting.  Dispense: 20 tablet; Refill: 1      Assessment & Plan Depression and anxiety Improved with Lexapro, with increased energy and mood. Occasional insomnia persists. Hydroxyzine  is used for panic attacks and may aid sleep. - Continue Lexapro. - Use hydroxyzine  for sleep if needed. - Encouraged increased daytime activity to aid sleep.  Essential hypertension Blood pressure is well-controlled. - Refilled blood pressure medication. Amlodipine  5 mg daily.  History of miscarriage Experienced a miscarriage at six weeks gestation. Emotional impact acknowledged, but she feels relieved as the pregnancy was unplanned. - Continue follow-up with Dr. Rutherford.  General Health Maintenance Declined flu and pneumonia vaccines. - Proceed with colonoscopy referral.      Return in about 3 months (around 02/10/2025) for recheck/follow-up.    Subjective:    Chief Complaint  Patient presents with   Medical Management of Chronic Issues    Pt in office for 4 wk follow up; pt     HPI Discussed the use of AI scribe software for clinical note transcription with the patient, who gave verbal consent to proceed.  History of Present Illness Katherine Hood is a 48 year old female who presents for follow-up on anxiety and depression.  She restarted  Lexapro four weeks ago, resulting in reduced depressive symptoms, increased laughter, and happiness. However, she experiences difficulty sleeping, which she attributes to the medication. She occasionally uses hydroxyzine  for panic attacks and has resumed Wegovy  after discontinuing it during her recent pregnancy.  Four weeks ago, she had a positive pregnancy test but experienced a miscarriage at six weeks. The pregnancy was unplanned and initially stressful due to financial concerns and potential job loss. Despite the miscarriage, she feels a sense of relief as she did not plan to have another child. She is currently on birth control to prevent future unplanned pregnancies.  Her father recently suffered a stroke while on vacation in [place] , complicated by kidney failure and pneumonia, requiring hospitalization and dialysis. He has since returned home but continues to have memory issues and is starting rehabilitation. This situation has been a significant source of stress, especially as she was unable to travel to [place]  to support her parents.  She is experiencing workplace stress due to potential layoffs and is concerned about her financial stability as her job is ending in December. She is currently on her husband's insurance and is managing the household, including caring for her nephews and son, which she finds both stressful and distracting from her personal challenges.  She is currently taking blood pressure medication and occasionally uses Zofran  for nausea.     Past Medical History:  Diagnosis Date   Allergy    Anxiety    Depression    GDM, class A2 09/22/2015   GERD (  gastroesophageal reflux disease)    Gestational diabetes    Gestational diabetes mellitus 08/23/2015   Gestational diabetes mellitus, antepartum    Hx of varicella    Hyperprolactinemia    Infertility, female    Medical history non-contributory    PCOS (polycystic ovarian syndrome)    PIH (pregnancy induced  hypertension) 09/24/2015   Postpartum care following cesarean delivery (9/24) 09/23/2015    Past Surgical History:  Procedure Laterality Date   CESAREAN SECTION N/A 09/23/2015   Procedure: CESAREAN SECTION;  Surgeon: Dickie Carder, MD;  Location: [facility];  Service: Obstetrics;  Laterality: N/A;   HERNIA REPAIR      Family History  Problem Relation Age of Onset   Diabetes Mother    Cancer Mother    Heart disease Mother    Depression Mother    Arthritis Mother    Asthma Mother    Diabetes Father    Kidney disease Father    Endometriosis Sister    Depression Sister    Hypertension Sister    Kidney disease Sister    Diabetes Sister    Hypertension Maternal Grandmother     Social History   Tobacco Use   Smoking status: Never   Smokeless tobacco: Never   Tobacco comments:    I do not smoke ever  Vaping Use   Vaping status: Never Used  Substance Use Topics   Alcohol use: Not Currently    Comment: Social Drinker   Drug use: No     Allergies  Allergen Reactions   Latex    Metformin  And Related Diarrhea    Pt had severe bloating and cramping with diarrhea    Paxlovid  [Nirmatrelvir -Ritonavir ] Itching and Rash    Review of Systems NEGATIVE UNLESS OTHERWISE INDICATED IN HPI      Objective:     BP 124/84 (BP Location: Left Arm, Patient Position: Sitting, Cuff Size: Large)   Pulse 80   Temp 97.6 F (36.4 C) (Temporal)   Ht 5' 8 (1.727 m)   Wt 246 lb 9.6 oz (111.9 kg)   LMP 10/22/2024 (Approximate)   SpO2 99%   BMI 37.50 kg/m   Wt Readings from Last 3 Encounters:  11/10/24 246 lb 9.6 oz (111.9 kg)  10/04/24 244 lb (110.7 kg)  08/04/24 256 lb 9.6 oz (116.4 kg)    BP Readings from Last 3 Encounters:  11/10/24 124/84  10/04/24 (!) 155/93  08/04/24 132/76     Physical Exam Vitals and nursing note reviewed.  Constitutional:      General: She is not in acute distress.    Appearance: Normal appearance. She is obese. She is not ill-appearing.  HENT:      Head: Normocephalic.     Right Ear: External ear normal.     Left Ear: External ear normal.  Eyes:     Extraocular Movements: Extraocular movements intact.     Conjunctiva/sclera: Conjunctivae normal.     Pupils: Pupils are equal, round, and reactive to light.  Cardiovascular:     Rate and Rhythm: Normal rate and regular rhythm.     Pulses: Normal pulses.     Heart sounds: Normal heart sounds. No murmur heard. Pulmonary:     Effort: Pulmonary effort is normal. No respiratory distress.     Breath sounds: Normal breath sounds. No wheezing.  Musculoskeletal:     Cervical back: Normal range of motion.  Skin:    General: Skin is warm.  Neurological:     Mental Status: She  is alert and oriented to person, place, and time.  Psychiatric:        Mood and Affect: Mood normal. Mood is not anxious. Affect is not tearful.             Sherril Shipman M Katura Eatherly, PA-C

## 2025-01-28 ENCOUNTER — Other Ambulatory Visit (HOSPITAL_COMMUNITY)
Admission: RE | Admit: 2025-01-28 | Discharge: 2025-01-28 | Disposition: A | Source: Ambulatory Visit | Attending: Physician Assistant | Admitting: Physician Assistant

## 2025-01-28 ENCOUNTER — Ambulatory Visit

## 2025-01-28 ENCOUNTER — Encounter: Payer: Self-pay | Admitting: Physician Assistant

## 2025-01-28 ENCOUNTER — Ambulatory Visit (INDEPENDENT_AMBULATORY_CARE_PROVIDER_SITE_OTHER): Admitting: Physician Assistant

## 2025-01-28 VITALS — BP 130/80 | HR 78 | Temp 98.1°F | Resp 18 | Ht 68.0 in | Wt 247.8 lb

## 2025-01-28 DIAGNOSIS — M25561 Pain in right knee: Secondary | ICD-10-CM

## 2025-01-28 DIAGNOSIS — Z113 Encounter for screening for infections with a predominantly sexual mode of transmission: Secondary | ICD-10-CM | POA: Diagnosis present

## 2025-01-28 DIAGNOSIS — G8929 Other chronic pain: Secondary | ICD-10-CM | POA: Diagnosis not present

## 2025-01-28 MED ORDER — MELOXICAM 15 MG PO TABS: 15.0000 mg | ORAL_TABLET | Freq: Every day | ORAL | 0 refills | Status: AC

## 2025-01-28 NOTE — Patient Instructions (Signed)
" °  VISIT SUMMARY: During your visit, we addressed your concerns about potential STD exposure and your chronic right knee pain.  YOUR PLAN: SEXUALLY TRANSMITTED INFECTION SCREENING: You are concerned about potential STD exposure following your husband's infidelity, although you have no symptoms and your husband recently tested negative. -We ordered a urine test for gonorrhea, chlamydia, and trichomoniasis. -We ordered blood tests for HIV and syphilis. -The samples have been sent to [facility] for processing.  CHRONIC RIGHT KNEE PAIN: You have chronic right knee pain that worsens with physical activity, especially during Zumba classes, and is associated with swelling and difficulty bearing weight. -We ordered a right knee X-ray. -We prescribed meloxicam  for 30 days to help control inflammation. -Avoid taking ibuprofen  and other over-the-counter NSAIDs during this period. -Avoid activities that exacerbate pain, such as jumping, squatting, and lunging. -Focus on low-impact exercises like abs, arms, and neck stretches. -We may refer you to Sports Medicine if your symptoms persist.    Contains text generated by Abridge.   "

## 2025-01-28 NOTE — Progress Notes (Signed)
 "   Patient ID: Katherine Hood, female    DOB: 03-20-1976, 49 y.o.   MRN: [id number]   Assessment & Plan:  Chronic pain of right knee -     DG Knee Complete 4 Views Right; Future  Other orders -     Meloxicam ; Take 1 tablet (15 mg total) by mouth daily. Take with food.  Dispense: 30 tablet; Refill: 0     Assessment & Plan Sexually transmitted infection screening Concern for potential exposure due to husband's infidelity. No symptoms reported. Husband's recent STD test results were negative. - Ordered urine test for gonorrhea, chlamydia, and trichomoniasis. - Ordered blood tests for HIV and syphilis. - Sent samples to [facility] for processing.  Chronic right knee pain Chronic right knee pain exacerbated by physical activity, particularly Zumba and hip hop classes. Pain and swelling above and below the knee, with difficulty bearing weight and performing activities such as stair climbing. No specific injury recalled, but pain initially started in the back of the knee. Differential includes meniscus irritation or other soft tissue injury. No significant ligament injury suspected as full range of motion is maintained. - Ordered right knee X-ray. - Prescribed meloxicam  for 30 days as a trial for inflammation control. - Advised against ibuprofen  and other over-the-counter NSAIDs during this period. - Recommended avoiding activities that exacerbate pain, such as jumping, squatting, and lunging. - Encouraged focusing on low-impact exercises like abs, arms, and neck stretches. - Discussed potential referral to Sports Medicine if symptoms persist.    F/up prn     Subjective:    Chief Complaint  Patient presents with   SEXUALLY TRANSMITTED DISEASE    Pt in office and requesting STI screening; husband seen physicians yesterday for screening and admits they have been counseling for infidelity so wanting to be screened. Also c/o right knee pain and making it hard to bear weight and resume  Zumba classes as well.      HPI Discussed the use of AI scribe software for clinical note transcription with the patient, who gave verbal consent to proceed.  History of Present Illness Katherine Hood is a 49 year old female who presents for STD testing and right knee pain.  She is concerned about potential STD exposure following her husband's infidelity. She has no symptoms of STDs and mentions that her husband recently tested negative for STDs.  She experiences right knee pain that has persisted since last year. The pain worsens with physical activity, especially during Zumba classes, and is associated with swelling above and below the knee. She has difficulty bearing weight and climbing or descending stairs. The pain began at the back of the knee and radiated down the leg. Occasional clicking and a pinching sensation occur when walking, causing her to walk on the side of her foot. Ibuprofen  helps reduce swelling, but she is reluctant to take it daily. The pain started after performing new yoga stretches, particularly those involving hip flexors.  She has a history of a miscarriage and reports frequent menstrual cycles since then. She is considering an IUD to manage her cycles but is concerned about potential weight gain.  She has received a pneumonia and flu shot recently and reports no recent illnesses.     Past Medical History:  Diagnosis Date   Allergy    Anxiety    Depression    GDM, class A2 09/22/2015   GERD (gastroesophageal reflux disease)    Gestational diabetes    Gestational diabetes mellitus 08/23/2015  Gestational diabetes mellitus, antepartum    Hx of varicella    Hyperprolactinemia    Infertility, female    Medical history non-contributory    PCOS (polycystic ovarian syndrome)    PIH (pregnancy induced hypertension) 09/24/2015   Postpartum care following cesarean delivery (9/24) 09/23/2015    Past Surgical History:  Procedure Laterality Date   CESAREAN  SECTION N/A 09/23/2015   Procedure: CESAREAN SECTION;  Surgeon: Dickie Carder, MD;  Location: [facility];  Service: Obstetrics;  Laterality: N/A;   HERNIA REPAIR      Family History  Problem Relation Age of Onset   Diabetes Mother    Cancer Mother    Heart disease Mother    Depression Mother    Arthritis Mother    Asthma Mother    Diabetes Father    Kidney disease Father    Endometriosis Sister    Depression Sister    Hypertension Sister    Kidney disease Sister    Diabetes Sister    Hypertension Maternal Grandmother     Social History[1]   Allergies[2]  Review of Systems NEGATIVE UNLESS OTHERWISE INDICATED IN HPI      Objective:     BP 130/80 (BP Location: Left Arm, Patient Position: Sitting, Cuff Size: Large)   Pulse 78   Temp 98.1 F (36.7 C) (Temporal)   Resp 18   Ht 5' 8 (1.727 m)   Wt 247 lb 12.8 oz (112.4 kg)   LMP 01/27/2025 (Exact Date)   BMI 37.68 kg/m   Wt Readings from Last 3 Encounters:  01/28/25 247 lb 12.8 oz (112.4 kg)  11/10/24 246 lb 9.6 oz (111.9 kg)  10/04/24 244 lb (110.7 kg)    BP Readings from Last 3 Encounters:  01/28/25 130/80  11/10/24 124/84  10/04/24 (!) 155/93     Physical Exam Vitals and nursing note reviewed.  Constitutional:      Appearance: Normal appearance. She is obese.  Eyes:     Extraocular Movements: Extraocular movements intact.     Conjunctiva/sclera: Conjunctivae normal.     Pupils: Pupils are equal, round, and reactive to light.  Musculoskeletal:     Right knee: Swelling present. No erythema or bony tenderness. Decreased range of motion (somewhat limited extension due to pain). No tenderness.  Neurological:     General: No focal deficit present.     Mental Status: She is alert and oriented to person, place, and time.  Psychiatric:        Mood and Affect: Mood normal.        Behavior: Behavior normal.             Fabrizio Filip M Terique Kawabata, PA-C    [1]  Social History Tobacco Use   Smoking  status: Never   Smokeless tobacco: Never   Tobacco comments:    I do not smoke ever  Vaping Use   Vaping status: Never Used  Substance Use Topics   Alcohol use: Not Currently    Comment: Social Drinker   Drug use: No  [2]  Allergies Allergen Reactions   Latex    Metformin  And Related Diarrhea    Pt had severe bloating and cramping with diarrhea    Paxlovid  [Nirmatrelvir -Ritonavir ] Itching and Rash   "

## 2025-01-29 LAB — URINE CYTOLOGY ANCILLARY ONLY
Chlamydia: NEGATIVE
Comment: NEGATIVE
Comment: NEGATIVE
Comment: NORMAL
Neisseria Gonorrhea: NEGATIVE
Trichomonas: NEGATIVE

## 2025-01-31 ENCOUNTER — Ambulatory Visit: Payer: Self-pay | Admitting: Physician Assistant

## 2025-02-02 LAB — SYPHILIS: RPR W/REFLEX TO RPR TITER AND TREPONEMAL ANTIBODIES, TRADITIONAL SCREENING AND DIAGNOSIS ALGORITHM: RPR Ser Ql: NONREACTIVE

## 2025-02-02 LAB — HIV ANTIBODY (ROUTINE TESTING W REFLEX): HIV Screen 4th Generation wRfx: NONREACTIVE

## 2025-02-10 ENCOUNTER — Ambulatory Visit: Admitting: Physician Assistant
